# Patient Record
Sex: Female | Born: 1980 | Race: White | Hispanic: Yes | Marital: Married | Smoking: Never smoker
Health system: Southern US, Community
[De-identification: ages and names within clinical notes are randomized; demographics above are authoritative.]

## PROBLEM LIST (undated history)

## (undated) DIAGNOSIS — I471 Supraventricular tachycardia, unspecified: Secondary | ICD-10-CM

## (undated) DIAGNOSIS — R42 Dizziness and giddiness: Secondary | ICD-10-CM

## (undated) DIAGNOSIS — R002 Palpitations: Secondary | ICD-10-CM

## (undated) DIAGNOSIS — I208 Other forms of angina pectoris: Secondary | ICD-10-CM

## (undated) DIAGNOSIS — I2089 Other forms of angina pectoris: Secondary | ICD-10-CM

## (undated) HISTORY — DX: Dizziness and giddiness: R42

## (undated) HISTORY — DX: Palpitations: R00.2

## (undated) HISTORY — PX: DILATION AND CURETTAGE OF UTERUS: SHX78

## (undated) HISTORY — DX: Other forms of angina pectoris: I20.89

## (undated) HISTORY — DX: Supraventricular tachycardia: I47.1

## (undated) HISTORY — DX: Supraventricular tachycardia, unspecified: I47.10

## (undated) HISTORY — DX: Other forms of angina pectoris: I20.8

---
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
## 2000-09-19 ENCOUNTER — Other Ambulatory Visit: Admission: RE | Admit: 2000-09-19 | Discharge: 2000-09-19 | Payer: Self-pay | Admitting: Gynecology

## 2001-12-01 ENCOUNTER — Encounter: Payer: Self-pay | Admitting: Obstetrics

## 2001-12-01 ENCOUNTER — Inpatient Hospital Stay (HOSPITAL_COMMUNITY): Admission: AD | Admit: 2001-12-01 | Discharge: 2001-12-01 | Payer: Self-pay | Admitting: Family Medicine

## 2001-12-26 ENCOUNTER — Encounter (INDEPENDENT_AMBULATORY_CARE_PROVIDER_SITE_OTHER): Payer: Self-pay | Admitting: Specialist

## 2001-12-26 ENCOUNTER — Inpatient Hospital Stay (HOSPITAL_COMMUNITY): Admission: AD | Admit: 2001-12-26 | Discharge: 2001-12-26 | Payer: Self-pay | Admitting: Obstetrics

## 2001-12-31 ENCOUNTER — Inpatient Hospital Stay (HOSPITAL_COMMUNITY): Admission: AD | Admit: 2001-12-31 | Discharge: 2001-12-31 | Payer: Self-pay | Admitting: Obstetrics

## 2002-01-01 ENCOUNTER — Encounter (INDEPENDENT_AMBULATORY_CARE_PROVIDER_SITE_OTHER): Payer: Self-pay | Admitting: Specialist

## 2002-01-01 ENCOUNTER — Ambulatory Visit (HOSPITAL_COMMUNITY): Admission: RE | Admit: 2002-01-01 | Discharge: 2002-01-01 | Payer: Self-pay | Admitting: Obstetrics

## 2002-01-01 ENCOUNTER — Encounter: Payer: Self-pay | Admitting: Obstetrics

## 2002-08-09 ENCOUNTER — Inpatient Hospital Stay (HOSPITAL_COMMUNITY): Admission: AD | Admit: 2002-08-09 | Discharge: 2002-08-09 | Payer: Self-pay | Admitting: Obstetrics & Gynecology

## 2003-10-06 ENCOUNTER — Inpatient Hospital Stay (HOSPITAL_COMMUNITY): Admission: AD | Admit: 2003-10-06 | Discharge: 2003-10-06 | Payer: Self-pay | Admitting: Obstetrics

## 2003-10-31 ENCOUNTER — Ambulatory Visit (HOSPITAL_COMMUNITY): Admission: RE | Admit: 2003-10-31 | Discharge: 2003-10-31 | Payer: Self-pay | Admitting: Obstetrics & Gynecology

## 2004-01-13 ENCOUNTER — Ambulatory Visit (HOSPITAL_COMMUNITY): Admission: RE | Admit: 2004-01-13 | Discharge: 2004-01-13 | Payer: Self-pay | Admitting: Obstetrics

## 2004-02-03 ENCOUNTER — Inpatient Hospital Stay (HOSPITAL_COMMUNITY): Admission: AD | Admit: 2004-02-03 | Discharge: 2004-02-07 | Payer: Self-pay | Admitting: Obstetrics

## 2004-02-03 ENCOUNTER — Inpatient Hospital Stay (HOSPITAL_COMMUNITY): Admission: AD | Admit: 2004-02-03 | Discharge: 2004-02-03 | Payer: Self-pay | Admitting: Obstetrics

## 2004-02-03 ENCOUNTER — Ambulatory Visit: Payer: Self-pay | Admitting: Cardiology

## 2004-02-04 ENCOUNTER — Encounter: Payer: Self-pay | Admitting: Cardiology

## 2004-02-04 ENCOUNTER — Ambulatory Visit: Payer: Self-pay | Admitting: Cardiology

## 2004-02-20 ENCOUNTER — Ambulatory Visit: Payer: Self-pay | Admitting: Internal Medicine

## 2004-03-05 ENCOUNTER — Ambulatory Visit: Payer: Self-pay | Admitting: Internal Medicine

## 2004-03-05 ENCOUNTER — Ambulatory Visit (HOSPITAL_COMMUNITY): Admission: RE | Admit: 2004-03-05 | Discharge: 2004-03-05 | Payer: Self-pay | Admitting: Obstetrics & Gynecology

## 2004-03-20 ENCOUNTER — Inpatient Hospital Stay (HOSPITAL_COMMUNITY): Admission: AD | Admit: 2004-03-20 | Discharge: 2004-03-24 | Payer: Self-pay | Admitting: Obstetrics & Gynecology

## 2004-03-25 ENCOUNTER — Encounter: Admission: RE | Admit: 2004-03-25 | Discharge: 2004-04-24 | Payer: Self-pay | Admitting: Obstetrics & Gynecology

## 2004-04-02 ENCOUNTER — Ambulatory Visit: Payer: Self-pay | Admitting: Internal Medicine

## 2004-09-01 ENCOUNTER — Ambulatory Visit: Payer: Self-pay | Admitting: Internal Medicine

## 2004-09-14 ENCOUNTER — Ambulatory Visit: Payer: Self-pay | Admitting: Internal Medicine

## 2004-09-15 ENCOUNTER — Ambulatory Visit (HOSPITAL_COMMUNITY): Admission: RE | Admit: 2004-09-15 | Discharge: 2004-09-15 | Payer: Self-pay | Admitting: Obstetrics & Gynecology

## 2004-09-30 ENCOUNTER — Inpatient Hospital Stay (HOSPITAL_COMMUNITY): Admission: AD | Admit: 2004-09-30 | Discharge: 2004-09-30 | Payer: Self-pay | Admitting: Obstetrics

## 2004-12-30 ENCOUNTER — Ambulatory Visit (HOSPITAL_COMMUNITY): Admission: RE | Admit: 2004-12-30 | Discharge: 2004-12-30 | Payer: Self-pay | Admitting: Obstetrics

## 2005-01-13 ENCOUNTER — Emergency Department (HOSPITAL_COMMUNITY): Admission: EM | Admit: 2005-01-13 | Discharge: 2005-01-13 | Payer: Self-pay | Admitting: *Deleted

## 2005-01-26 ENCOUNTER — Ambulatory Visit: Payer: Self-pay | Admitting: Internal Medicine

## 2005-01-26 ENCOUNTER — Inpatient Hospital Stay (HOSPITAL_COMMUNITY): Admission: AD | Admit: 2005-01-26 | Discharge: 2005-01-26 | Payer: Self-pay | Admitting: Obstetrics & Gynecology

## 2005-01-26 ENCOUNTER — Inpatient Hospital Stay (HOSPITAL_COMMUNITY): Admission: AD | Admit: 2005-01-26 | Discharge: 2005-01-28 | Payer: Self-pay | Admitting: Obstetrics

## 2005-02-17 ENCOUNTER — Ambulatory Visit: Payer: Self-pay | Admitting: Cardiology

## 2005-02-19 ENCOUNTER — Ambulatory Visit: Payer: Self-pay

## 2005-02-19 ENCOUNTER — Encounter: Payer: Self-pay | Admitting: Cardiology

## 2005-02-22 ENCOUNTER — Ambulatory Visit: Payer: Self-pay | Admitting: Internal Medicine

## 2005-03-02 ENCOUNTER — Encounter: Admission: RE | Admit: 2005-03-02 | Discharge: 2005-03-17 | Payer: Self-pay | Admitting: Obstetrics & Gynecology

## 2005-04-13 ENCOUNTER — Inpatient Hospital Stay (HOSPITAL_COMMUNITY): Admission: AD | Admit: 2005-04-13 | Discharge: 2005-04-17 | Payer: Self-pay | Admitting: Obstetrics

## 2005-04-15 ENCOUNTER — Encounter (INDEPENDENT_AMBULATORY_CARE_PROVIDER_SITE_OTHER): Payer: Self-pay | Admitting: *Deleted

## 2005-05-06 ENCOUNTER — Ambulatory Visit: Payer: Self-pay | Admitting: Internal Medicine

## 2006-10-24 ENCOUNTER — Ambulatory Visit: Payer: Self-pay | Admitting: Internal Medicine

## 2007-09-09 IMAGING — US US OB LIMITED
1 series · 3 of 3 positions shown · non-contrast
Comparison: none

CLINICAL DATA: 38 weeks, limited study to assess presentation.

 LIMITED OBSTETRICAL ULTRASOUND:
 There is a single live intrauterine gestation in cephalic presentation.

[Series 1: ob ltd · 3 of 3 slices shown]
[im 1/3]
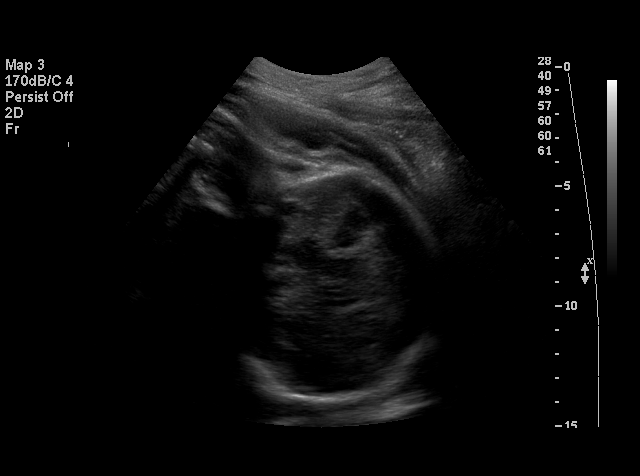
[im 2/3]
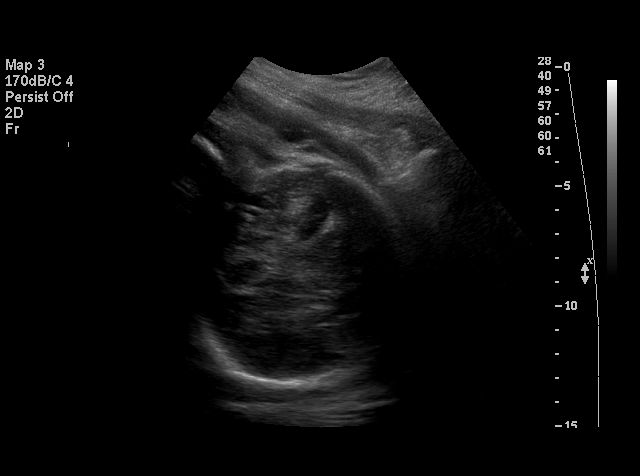
[im 3/3]
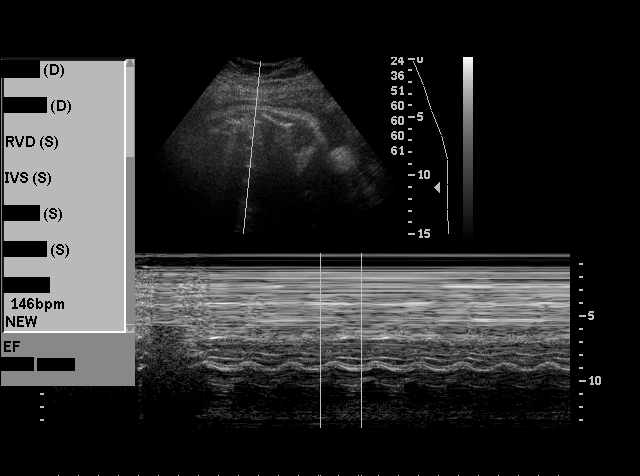

[3 of 3 positions shown; findings below may reference images not displayed]

IMPRESSION: Single live intrauterine gestation in cephalic presentation.

## 2009-05-09 ENCOUNTER — Emergency Department (HOSPITAL_COMMUNITY): Admission: EM | Admit: 2009-05-09 | Discharge: 2009-05-09 | Payer: Self-pay | Admitting: Emergency Medicine

## 2010-02-01 NOTE — L&D Delivery Note (Signed)
Delivery Note At 11:07 PM a viable female was delivered via Vaginal, Spontaneous Delivery (Presentation: Left Occiput Anterior).  APGAR: , ; weight .   Placenta status: Intact, Spontaneous.  Cord: 3 vessels with the following complications: None.    Anesthesia: Epidural  Episiotomy: None Lacerations: None Suture Repair: N/A Est. Blood Loss (mL): 200  Mom to postpartum.  Baby to nursery-stable.  JACKSON-MOORE,Sabre Leonetti A 08/29/2010, 11:19 PM

## 2010-02-25 LAB — RUBELLA ANTIBODY, IGM: Rubella: IMMUNE

## 2010-02-25 LAB — HEPATITIS B SURFACE ANTIGEN: Hepatitis B Surface Ag: NEGATIVE

## 2010-02-25 LAB — TYPE AND SCREEN: Antibody Screen: NEGATIVE

## 2010-03-29 ENCOUNTER — Encounter: Payer: Self-pay | Admitting: Internal Medicine

## 2010-03-29 ENCOUNTER — Observation Stay (HOSPITAL_COMMUNITY)
Admission: AD | Admit: 2010-03-29 | Discharge: 2010-03-30 | Disposition: A | Discharge status: Home / Self Care | Payer: Medicaid Other | Source: Ambulatory Visit | Attending: Obstetrics & Gynecology | Admitting: Obstetrics & Gynecology

## 2010-03-29 DIAGNOSIS — R002 Palpitations: Secondary | ICD-10-CM | POA: Insufficient documentation

## 2010-03-29 DIAGNOSIS — I498 Other specified cardiac arrhythmias: Secondary | ICD-10-CM | POA: Insufficient documentation

## 2010-03-29 DIAGNOSIS — O99891 Other specified diseases and conditions complicating pregnancy: Principal | ICD-10-CM | POA: Insufficient documentation

## 2010-03-29 DIAGNOSIS — I471 Supraventricular tachycardia: Secondary | ICD-10-CM

## 2010-03-29 LAB — CBC
HCT: 35.3 % — ABNORMAL LOW (ref 36.0–46.0)
WBC: 9.4 10*3/uL (ref 4.0–10.5)

## 2010-03-30 LAB — COMPREHENSIVE METABOLIC PANEL
ALT: 20 U/L (ref 0–35)
Albumin: 3 g/dL — ABNORMAL LOW (ref 3.5–5.2)
Alkaline Phosphatase: 27 U/L — ABNORMAL LOW (ref 39–117)
BUN: 6 mg/dL (ref 6–23)
CO2: 26 mEq/L (ref 19–32)
Calcium: 9.1 mg/dL (ref 8.4–10.5)
GFR calc Af Amer: >60 mL/min (ref >60)
Sodium: 136 mEq/L (ref 135–145)

## 2010-03-30 LAB — MRSA PCR SCREENING: MRSA by PCR: NEGATIVE

## 2010-03-30 LAB — TSH: TSH: 4.976 u[IU]/mL — ABNORMAL HIGH (ref 0.350–4.500)

## 2010-03-30 LAB — MAGNESIUM: Magnesium: 1.9 mg/dL (ref 1.5–2.5)

## 2010-03-31 DIAGNOSIS — R002 Palpitations: Secondary | ICD-10-CM | POA: Insufficient documentation

## 2010-03-31 DIAGNOSIS — R42 Dizziness and giddiness: Secondary | ICD-10-CM | POA: Insufficient documentation

## 2010-03-31 DIAGNOSIS — I471 Supraventricular tachycardia, unspecified: Secondary | ICD-10-CM | POA: Insufficient documentation

## 2010-04-01 ENCOUNTER — Encounter: Payer: Self-pay | Admitting: Internal Medicine

## 2010-04-01 ENCOUNTER — Ambulatory Visit (INDEPENDENT_AMBULATORY_CARE_PROVIDER_SITE_OTHER): Payer: Self-pay | Admitting: Internal Medicine

## 2010-04-01 DIAGNOSIS — I471 Supraventricular tachycardia: Secondary | ICD-10-CM

## 2010-04-09 NOTE — Assessment & Plan Note (Signed)
Summary: ec6/per pt call-mj/wpa   Visit Type:  Initial Consult   History of Present Illness: [name] (previously [place]) returns today for followup of her SVT. She has a h/o gestational SVT. When I first met her several years ago, she had almost incessant SVT which appeared to originate from the left atrium based on her P wave morphology. It was not adenosine sensitive. After delivery, her SVT always has gone away. With her second pregnancy, she had recurrent SVT and also was able to deliver without consequence. During her pregnancy the combination of a beta blocker and digoxin was fairly effective at controlling her symptoms. She is now late in the first trimester with her third pregnancy. She developed SVT several days ago and was taken to the ED at [facility] and observed and started back on her digoxin and beta blocker. She returns today for followup.  No syncope.  Current Medications (verified): 1)  Digoxin 0.25 Mg Tabs (Digoxin) .... Take One Tablet By Mouth Daily 2)  Metoprolol Tartrate 25 Mg Tabs (Metoprolol Tartrate) .... 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    Vital Signs:  Patient profile:   30 year old female Height:      68 inches Weight:      248 pounds BMI:     37.84 Pulse rate:   105 / minute BP sitting:   126 / 74  (left arm)  Vitals Entered By: Laurance Flatten CMA (April 01, 2010 10:51 AM)  Physical Exam  General:  Obese, well developed, well nourished young woman in no acute distress.  HEENT: normal Neck: supple. No JVD. Carotids 2+ bilaterally no bruits Cor: RRR no rubs, gallops or murmur Lungs: CTA Ab: soft, nontender.  nondistended. No HSM. Good bowel sounds Ext: warm. no cyanosis, clubbing or edema Neuro: alert and oriented. Grossly nonfocal. affect pleasant    EKG  Procedure date:  04/01/2010  Findings:      Sinus tachycardia with rate of: 105.   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 She will continue on metoprolol and digoxin. I have asked her to remain active and I will see her back in several months. As she has had 2 previously normal pregnancies (except for SVT), I have every reason to believe she will have a third successful pregnancy. Ideally she would not require beta blockers or digoxing until her third trimester but at this point I have recommend a period of watchful waiting. Her updated medication list for this problem includes:    Metoprolol Tartrate 25 Mg Tabs (Metoprolol tartrate) .Marland Kitchen... Take one tablet by mouth 4 times a day  Other Orders: EKG w/ Interpretation (93000)  Patient Instructions: 1)  Your physician recommends that you schedule a follow-up appointment in: 3 MONTHS WITH DR Ladona Ridgel 2)  Your physician recommends that you continue on your current medications as directed. Please refer to the Current Medication list given to you today.

## 2010-04-14 NOTE — Consult Note (Signed)
Summary: [facility] Consultation Report  [facility] Consultation Report   Imported By: Earl Many 04/10/2010 10:16:28  _____________________________________________________________________  External Attachment:    Type:   Image     Comment:   External Document

## 2010-04-20 NOTE — Consult Note (Signed)
[name], [name]              ACCOUNT NO.:  [id number]  MEDICAL RECORD NO.:  [id number]           PATIENT TYPE:  I  LOCATION:  9371                          [facility]ILITY:  [facility]  PHYSICIAN:  Georga Hacking, M.D.DATE OF BIRTH:  04-Apr-1980  DATE OF CONSULTATION:  03/29/2010                                CONSULTATION   HISTORY OF PRESENT ILLNESS:  This 30 year old Hispanic female has a history of pregnancy-induced left atrial tachycardia.  First episode occurred in January 2006 and was refractory at that time to conversion with adenosine.  She was ultimately treated with Lanoxin as well as beta- blockers.  She had recurrent SVT at a second pregnancy and was found to have incessant SVT due to left atrial tachycardia, was treated with IV Lopressor and digoxin.  She ultimately had a successful delivery and was seen in followup last by Dr. Ladona Ridgel in September 2008.  She was taken off her medications.  He recommended watchful waiting at that time and she had really not had any recurrence of SVT and she became pregnant again at this time.  She is about 17 weeks' pregnant and had the onset of palpitations and SVT this afternoon, came to the emergency room and was found to be in SVT at a rate of 140.  She spontaneously terminated and is currently in the ICU.  She is supposed to see Dr. Ladona Ridgel on Tuesday.  PAST MEDICAL HISTORY:  Remarkable for recurrent headaches as well as SVT.  PAST SURGICAL HISTORY:  D and C.  ALLERGIES:  None.  CURRENT MEDICATIONS:  Prenatal vitamins.  SOCIAL HISTORY:  She lives in [place] with her husband and has 2 children.  She is pregnant with her third.  FAMILY HISTORY:  No premature cardiac disease.  REVIEW OF SYSTEMS:  Otherwise felt well and normally works out.  Not normally any chest pain.  PHYSICAL EXAMINATION:  GENERAL:  She is a pleasant obese Hispanic female who is currently in no acute distress. VITAL SIGNS:  Blood pressure is currently  130/75.  Pulse is currently 90- 99 and regular. SKIN:  Warm and dry. ENT:  EOMI.  PERRLA.  C and S clear.  Fundi not examined negative. NECK:  Supple without masses, JVD, thyromegaly, or bruits. LUNGS:  Clear to A and P. CARDIAC:  Regular rhythm.  Normal S1 and S2.  No S3 or murmur. ABDOMEN:  Soft and gravid.  Good bowel sounds noted. EXTREMITIES:  No clubbing, cyanosis, or edema. NEUROLOGIC:  Alert and oriented x3.  Cranial nerves were normal.  Moves all extremities.  EKG shows supraventricular tachycardia at a rate of 140 initially.  She is currently in sinus rhythm.  LABORATORY DATA:  Hemoglobin of 11.8, hematocrit 35.3.  Potassium is 3.7, otherwise chemistry panel was normal.  IMPRESSION:  Pregnancy-induced left atrial tachycardia, spontaneously terminated tonight.  RECOMMENDATIONS:  At this time monitor overnight with a history of this being incessant before.  Would go ahead and restart Lanoxin as well as metoprolol.  She will be followed up in the morning by [facility].     Georga Hacking, M.D.     WST/MEDQ  D:  03/30/2010  T:  03/30/2010  Job:  [id number]  cc:   Doylene Canning. Ladona Ridgel, MD  Electronically Signed by Lacretia Nicks. Donnie Aho M.D. on 04/20/2010 05:01:27 PM

## 2010-04-22 LAB — URINALYSIS, ROUTINE W REFLEX MICROSCOPIC
Leukocytes, UA: NEGATIVE
Urobilinogen, UA: 1 mg/dL (ref 0.0–1.0)
pH: 8 (ref 5.0–8.0)

## 2010-04-22 LAB — RAPID STREP SCREEN (MED CTR MEBANE ONLY): Streptococcus, Group A Screen (Direct): NEGATIVE

## 2010-04-22 LAB — URINE MICROSCOPIC-ADD ON

## 2010-06-13 ENCOUNTER — Encounter: Payer: Self-pay | Admitting: Internal Medicine

## 2010-06-16 ENCOUNTER — Ambulatory Visit (INDEPENDENT_AMBULATORY_CARE_PROVIDER_SITE_OTHER): Payer: Medicaid Other | Admitting: Internal Medicine

## 2010-06-16 ENCOUNTER — Encounter: Payer: Self-pay | Admitting: Internal Medicine

## 2010-06-16 VITALS — BP 110/66 | Ht 68.0 in | Wt 265.0 lb

## 2010-06-16 DIAGNOSIS — I471 Supraventricular tachycardia, unspecified: Secondary | ICD-10-CM

## 2010-06-16 NOTE — Assessment & Plan Note (Signed)
[facility]                         ELECTROPHYSIOLOGY OFFICE NOTE   KAMILLA, HANDS                       MRN:          [id number]  DATE:10/24/2006                            DOB:          1994/12/25    Ms. Vanessa Barbara returns today for follow up.  She is a very pleasant, young  woman with a history of pregnancy induced SVT (left atrial tachycardia)  who returns today for followup having not been seen in over a year.  The  patient states that a couple of weeks ago she had a viral type syndrome  with a cold, and during that period developed some chest discomfort,  which would come on fleeting quick and last just a few seconds and then  go away.  It would come back-and-forth off-and-on.  It has subsequently  resolved along with her viral syndrome.  She denies any palpitations.  The patient has previously had very incessant SVT during pregnancy.  When asked today, about whether she continues to plan on becoming  pregnant again; she states that she does not plan this.  She is not work  presently.  She has been caring for her 75-and-30-year-old daughters.  She  is on no medications.   PHYSICAL EXAM:  Notable for a pleasant, well-appearing young woman in no  distress.  Blood pressure 132/75, pulse 75 and regular, respirations were 16.  The  weight was 231 pounds.  NECK:  Revealed no jugular venous distention.  LUNGS:  Clear bilaterally to auscultation.  No wheezes, rales, or  rhonchi were present.  CARDIOVASCULAR:  Exam revealed a regular rate and rhythm with a normal  S1-S2.  EXTREMITIES:  Demonstrated no cyanosis, clubbing, or edema.  The pulses  were 2+ and symmetric.   EKG demonstrates a sinus rhythm with normal axis and sinus arrhythmia.   IMPRESSION:  1. History of supraventricular tachycardia.  2. Chest pain associated with recent viral syndrome.   DISCUSSION:  Ms. Vanessa Barbara is otherwise stable.  Her chest pain has  resolved, and her palpitations,  and SVT have resolved.  I have  recommended a day period of watchful waiting.  I have asked her to call  if she has any additional problems with her SVT or with her chest  discomfort.  We will plan to see her back in the office on a p.r.n.  basis.     Doylene Canning. Ladona Ridgel, MD  Electronically Signed    GWT/MedQ  DD: 10/24/2006  DT: 10/24/2006  Job #: [id number]

## 2010-06-16 NOTE — Progress Notes (Signed)
HPI [name] returns today for followup. She is a pleasant young woman with a history of pregnancy-induced SVT. She is now in her third trimester of pregnancy. I saw her last several weeks ago when she was having tachy-palpitations and we started her back on her beta blocker and digoxin. She was initially noncompliant but now notes that she has gone back on her medicines. Since she started her medicines back she's been asymptomatic. She denies chest pain or shortness of breath. Not on File   Current Outpatient Prescriptions  Medication Sig Dispense Refill  . digoxin (LANOXIN) 0.25 MG tablet Take 250 mcg by mouth daily.        . metoprolol tartrate (LOPRESSOR) 25 MG tablet Take 25 mg by mouth 2 (two) times daily.           Past Medical History  Diagnosis Date  . Dizziness   . Palpitations   . SVT (supraventricular tachycardia)     ROS:   All systems reviewed and negative except as noted in the HPI.   Past Surgical History  Procedure Date  . Dilation and curettage of uterus      Family History  Problem Relation Age of Onset  . Diabetes       History   Social History  . Marital Status: Married    Spouse Name: N/A    Number of Children: N/A  . Years of Education: N/A   Occupational History  . Not on file.   Social History Main Topics  . Smoking status: Not on file  . Smokeless tobacco: Not on file  . Alcohol Use: Not on file  . Drug Use: Not on file  . Sexually Active: Not on file   Other Topics Concern  . Not on file   Social History Narrative    She lives in [place] with her husband and has 2  children.  She is pregnant with her third.      BP 110/66  Ht 5\' 8"  (1.727 m)  Wt 265 lb (120.203 kg)  BMI 40.29 kg/m2  Physical Exam:  Pregnant, well appearing NAD HEENT: Unremarkable Neck:  No JVD, no thyromegally Lymphatics:  No adenopathy Back:  No CVA tenderness Lungs:  Clear HEART:  Regular rate rhythm, no murmurs, no rubs, no clicks Abd:   Flat, positive bowel sounds, no organomegally, no rebound, no guarding Ext:  2 plus pulses, no edema, no cyanosis, no clubbing Skin:  No rashes no nodules Neuro:  CN II through XII intact, motor grossly intact  EKG Normal sinus rhythm   Assess/Plan:

## 2010-06-16 NOTE — Assessment & Plan Note (Signed)
Her symptoms appear to be fairly well controlled. I recommend that she continue her current medications. I do not need to see her back unless she has recurrent and symptomatic SVT despite medical therapy.

## 2010-06-16 NOTE — Patient Instructions (Signed)
Your physician recommends that you schedule a follow-up appointment as needed  

## 2010-06-19 NOTE — Discharge Summary (Signed)
[name], [name]                ACCOUNT NO.:  [id number]   MEDICAL RECORD NO.:  [id number]          PATIENT TYPE:  INP   LOCATION:  9373                          [facility]ILITY[facility]  [facility]   PHYSICIAN:  [name], M.D.DATE OF BIRTH:  28-Oct-1980   DATE OF ADMISSION:  02/03/2004  DATE OF DISCHARGE:  02/07/2004                                 DISCHARGE SUMMARY   CHIEF COMPLAINT:  The patient is a 30 year old para 0 with an estimated date  of confinement of March 30, 2004, who presents complaining of  palpitations.   HISTORY OF PRESENT ILLNESS:  The patient noted the onset of these symptoms  in the first trimester of pregnancy.  A recent EKG approximately two weeks  prior to presentation demonstrated a ventricular rate of 130 beats/minute.  No previous cardiology evaluation had been performed.  On the day of this  presentation she was found to have a ventricular rate of 180 beats/minute.  She was evaluated in [facility] Emergency Room and then transferred to the adult  ICU at the [facility].  Cardiology was consulted as well as maternal  fetal medicine.   PAST OBSTETRICAL GYNECOLOGIC HISTORY:  She is status post a spontaneous  abortion in December 2004 late first trimester with a dilatation and  curettage.   PAST MEDICAL HISTORY:  She denies.   PAST SURGICAL HISTORY:  Please see the above.   FAMILY HISTORY:  Noncontributory.   SOCIAL HISTORY:  No tobacco, ethanol, or substance abuse.   ALLERGIES:  No known drug allergies.   MEDICATIONS:  Prenatal vitamins.   PRENATAL SCREENS:  Hemoglobin 12.1, platelets 216,000.  Blood type B  positive.  RH antibody negative.  Sickle cell trait negative.  RPR  nonreactive.  Rubella immune.  Hepatitis B surface antigen negative.  HIV  nonreactive.   PHYSICAL EXAMINATION:  VITAL SIGNS:  Heart rate 150, blood pressure 160/108,  respiratory rate 16.  This was subsequent at an attempt at cardioversion  with adenosine.  The fetal heart  tracing was reassuring.  GENERAL:  Well-developed and well-nourished in no apparent distress.  HEART:  Tachycardiac.  ABDOMEN:  Nontender.  PELVIC:  Deferred.   ASSESSMENT:  Intrauterine pregnancy at 32 plus weeks with maternal  tachycardiac arrhythmia.   PLAN:  Admission and further treatment by cardiology.   HOSPITAL COURSE:  The patient was admitted.  An attempt to control her rate  with intravenous Lopressor was given.  A course of steroids was given in the  event that preterm delivery was felt to be necessary.  She was started on  digoxin with beta blocker and she was felt to have an atrial tachycardia at  this point.  An ultrasound on February 04, 2004 demonstrated an estimated  fetal weight percentiles greater than the 95th percentile symmetric with an  AFI of 10.  Thyroid function studies were normal.  She improved with the  regimen of digoxin and Lopressor.  She was felt to be in normal sinus  rhythm.  An echocardiogram was performed during this admission as well.  The  transthoracic echocardiogram performed on February 04, 2004 was essentially  normal.   DISCHARGE DIAGNOSES:  Intrauterine pregnancy at 32 plus weeks with a  maternal atrial tachycardia.   CONDITION:  Stable.   DIET:  Regular.   ACTIVITY:  No strenuous activity.   MEDICATIONS:  Included:  1.  Lopressor.  2.  Digoxin.  3.  K-Dur.   DISPOSITION:  The patient was to follow up at [facility] on  February 10, 2004 at 9 a.m.  Her followup appointment at [facility] was February 13, 2004 at 9:30 a.m.  She was also to follow up  with cardiology.      LAJ/MEDQ  D:  02/26/2004  T:  02/26/2004  Job:  [id number]

## 2010-06-19 NOTE — Consult Note (Signed)
NAMEJODI, Francis NO.:  [id number]   MEDICAL RECORD NO.:  [id number]          PATIENT TYPE:  EMS   LOCATION:  MAJO                         FACILITY:  [facility]   PHYSICIAN:  Arvilla Meres, M.D. LHCDATE OF BIRTH:  02-05-1980   DATE OF CONSULTATION:  01/26/2005  DATE OF DISCHARGE:  01/26/2005                                   CONSULTATION   OBSTETRICIAN:  Dr. Antionette Char   CARDIOLOGIST:  Dr. Lewayne Bunting   REASON FOR CONSULTATION:  Palpitations secondary to recurrent  supraventricular tachycardia.   PATIENT IDENTIFICATION:  [name] is a 30 year old woman without  significant past medical history except for paroxysmal supraventricular  tachycardia secondary to left atrial tachycardia.   In reviewing the records she was admitted to the hospital in January of 2006  during her first pregnancy with palpitations.  She was found to have an  incessant left atrial tachycardia which was not adenosine-responsive.  She  was hospitalized for about a week and treated with IV digoxin and beta  blockers and her rhythm eventually broke.  The rest of her pregnancy was  unremarkable.  She is currently 27-3/[redacted] weeks pregnant.  She was doing well  without evidence of recurrent palpitations until yesterday morning when she  once again had palpitations and felt weak.  This morning the palpitations  progressed.  She took once dose of digoxin without an effect and finally  went to [facility].  At that time she was found to have recurrent SVT  with heart rates in the 170-180 range and transferred to the [facility] ER.  In the ER here she was given Lopressor 5 mg IV and digoxin 0.1 mg.  This  dropped her blood pressure to the 70s and she was resuscitated with  intravenous fluids and now her systolic blood pressures are in the mid 90s.  I gave her an additional dose of digoxin 0.5 mg IV; however, her heart rates  remained in the 150-160 range.  She does feel weak with  this and mildly  lightheaded, but no frank syncope.  She denies any chest pain or significant  dyspnea.  She has had some mild lower extremity edema, but no overt heart  failure.  She has not had any abdominal pain or bleeding.   REVIEW OF SYSTEMS:  As per HPI and problem list, otherwise negative.   PAST MEDICAL HISTORY:  Paroxysmal SVT related to pregnancy secondary to left  atrial tachycardia.  This is non-responsive to adenosine.   CURRENT MEDICATIONS:  Prenatal vitamins, Lopressor p.r.n., digoxin p.r.n.   ALLERGIES:  He has no known drug allergies.   SOCIAL HISTORY:  She lives in [place] with her husband.  She has a 1-year-  old daughter.  She denies any tobacco or alcohol use.   FAMILY HISTORY:  There is no history of premature coronary disease.  There  is no history of long QT syndrome.  There is no history of SVT or AV nodal  re-entrant tachycardia.   PHYSICAL EXAMINATION:  GENERAL:  She is lying nearly flat in bed.  She is  comfortable.  Respirations are unlabored.  VITAL SIGNS:  Blood pressure 95/70 after 1 L fluid resuscitation.  Heart  rate is about 150-160.  This is regular.  Saturations in the high 90s on 2  L.  HEENT:  Sclerae anicteric.  EOMI.  There is no xanthelasma.  Mucous  membranes are moist.  NECK:  Supple.  There is no obvious JVD.  Carotids are 2+ bilaterally  without any bruits.  There is no lymphadenopathy or thyromegaly.  CARDIAC:  She is tachycardic and regular.  There are no murmurs, rubs, or  gallops.  LUNGS:  Clear to auscultation.  ABDOMEN:  Gravid, but soft, nontender.  She is wearing a fetal monitor.  There are good bowel sounds.  EXTREMITIES:  Warm with no clubbing, cyanosis.  Distal pulses are 1+  bilaterally.  There is trace edema.  NEUROLOGIC:  She is alert and oriented x3.  Cranial nerves II-XII are  intact.  She moves all four extremities without difficulty.  Affect is  bright.   Telemetry shows supraventricular tachycardia, rate of  150-160.  There is no  formal EKG at this point.   ASSESSMENT:  1.  Recurrent paroxysmal supraventricular tachycardia secondary to left      atrial tachycardia.  2.  27-1/[redacted] weeks gestation.  3.  Mild hypotension.   PLAN:  1.  I have discussed the situation with our electrophysiologist, Dr. Lewayne Bunting as well as Dr. Gaynell Face who is covering for her obstetrics group.      At this point we will admit her to the [facility] ICU for      observation of her heart rate and fetal monitoring.  2.  We will load her with digoxin, give her an additional 0.25 mg IV now and      repeat this in four hours.  3.  We will check a digoxin level and a TSH in the morning.  4.  We will also treat her with Lopressor 2.5 mg IV q.6h. as her blood      pressure tolerates in holding for a systolic blood pressure of less than      90.  5.  Will hydrate her aggressively to maintain a blood pressure.  6.  If there is evidence of significant fetal distress we may need to      consider a cesarean section.  We will go ahead and treat her      beclomethasone as per Dr. Elsie Stain request.   We will continue to follow with you.  Please do not hesitate to call with  questions.      Arvilla Meres, M.D. [facility]  Electronically Signed     DB/MEDQ  D:  01/26/2005  T:  01/27/2005  Job:  [id number]

## 2010-06-19 NOTE — Consult Note (Signed)
[name], BEG NO.:  [id number]   MEDICAL RECORD NO.:  [id number]          PATIENT TYPE:  EMS   LOCATION:  MAJO                         FACILITY:  [facility]   PHYSICIAN:  Vida Roller, M.D.   DATE OF BIRTH:  September 29, 1980   DATE OF CONSULTATION:  DATE OF DISCHARGE:                                   CONSULTATION   CARDIOLOGY CONSULTATION   PRIMARY CARE PHYSICIAN:  Dr. Clearance Coots.  She has not ever seen a cardiologist.   HISTORY OF PRESENT ILLNESS:  [name] is a 30 year old woman who is [redacted]  weeks pregnant with her first child.  She is G2, P0, AB1, who has had an  uncomplicated pregnancy with the exception of repeated episodes of  tachycardia.  She was seen in the emergency department at [facility]  on January 13, 2004, evaluated and found to be in sinus tachycardia, and  sent home.  She subsequently came in today for a worsening episode of  tachycardia and was found to be in supraventricular tachycardia and  transported emergently to the emergency department at [facility]  where I was asked to consult.  She was given 6 mg and then 12 mg of  adenosine IV without significant breaking of her rhythm.  I gave her an  additional 12 mg of adenosine IV.  She did break the rhythm with this but  then went right back into a narrow complex tachycardia.  She is  hemodynamically stable.  She denies syncope.  She denies chest discomfort.  She denies any shortness of breath.  She has no family history of cardiac  sudden death or cardiac problems.   PAST MEDICAL HISTORY:  Really completely uncomplicated.  She is not allergic  to any medications.  She is on no medications other than prenatal vitamins  and iron.  Her pregnancy is under the care of Dr. Clearance Coots and she has had no  problems with the exception of the tachycardia.   PAST SURGICAL HISTORY:  Noncontributory.   FAMILY HISTORY:  She has no history of long QT syndrome or cardiac sudden  death.  There is  no history of supraventricular tachycardia or AV nodal  reentry tachycardia.   REVIEW OF SYSTEMS:  Generally negative for a person who is pregnant.  She is  otherwise healthy.  She does not smoke, drink, or use illicit drugs.  She  works as a Production designer, theatre/television/film at [employer] but has recently quit her job at the late  terms of her pregnancy with plans to stay home with her child.  Her child's  perinatal history has also been normal.   PHYSICAL EXAMINATION:  GENERAL:  She is a well-developed, well-nourished,  obviously gravid Hispanic female in no apparent distress, alert and oriented  x4 and a very good historian.  Her heart rate is 180 in supraventricular  tachycardia.  Her blood pressure is 130/70, respiratory rate 14, and she is  afebrile.  HEENT:  Unremarkable.  NECK:  Supple.  There is no jugular venous distention or carotid bruits.  CHEST:  Is clear to auscultation.  CARDIAC EXAM:  Tachycardic with no obvious murmur.  ABDOMEN:  Obviously gravid but soft.  She has a field monitor in place which  appears to show a relatively reactive tracing.  EXTREMITIES:  Her lower extremities are without clubbing, cyanosis, or  edema.  Her pulses are all 2+.   STUDIES:  Her electrocardiogram shows a narrow complex tachycardia at a rate  of 176 with no PR interval.  She has a QRS duration of 83 milliseconds and a  QT corrected of 456.   LABORATORY DATA:  Her white blood cell count is 9.3, hemoglobin 10,  hematocrit 30, with a platelet count of 239,000.  Her sodium is 136,  potassium 3.4, chloride 107, bicarb 24, glucose 110, BUN 4, creatinine 0.6,  calcium 8.9.  The remainder of her liver function studies are within normal  limits.   ASSESSMENT:  This is a woman with supraventricular tachycardia likely from a  reentry nodal focus.  It is resistant to adenosine, likely due to her gravid  state and also her electrolyte disorders.  She also has an intrauterine  pregnancy which appears to be doing fine.  I  have had discussions with both  Dr. Penne Lash, Dr. Gavin Potters, and Dr. Ladona Ridgel regarding this woman's condition  and our plan is to use IV beta blockers to try to slow her heart rate down.  This appears to be a safe method in pregnancy and she should be able to  tolerate it fine.  We are going to transfer her back to [facility] to  the intensive care unit there where she will be observed in a facility that  has the ability to rapidly deliver this child if indeed she becomes  unstable.      Trey Paula   JH/MEDQ  D:  02/03/2004  T:  02/03/2004  Job:  [id number]

## 2010-06-19 NOTE — H&P (Signed)
[name], [name]                ACCOUNT NO.:  [id number]   MEDICAL RECORD NO.:  [id number]          PATIENT TYPE:  INP   LOCATION:  9374                          [facility]  [facility]   PHYSICIAN:  [name], M.D.DATE OF BIRTH:  May 04, 1980   DATE OF ADMISSION:  04/13/2005  DATE OF DISCHARGE:                                HISTORY & PHYSICAL   CHIEF COMPLAINT:  The patient is a 30 year old gravida 3, para 1 with an  estimated date of confinement of April 24, 2005 with an intrauterine  pregnancy at 38+ weeks with paroxysmal supraventricular tachyarrhythmia  secondary to left atrial tachycardia, now complaining of palpitations and  light headedness.   HISTORY OF PRESENT ILLNESS:  Please see the above. The patient has been  followed collaboratively this pregnancy with cardiology for the  tachyarrhythmia. As per the cardiologist's recommendation, her medications  to control her ventricular rate were titrated over the past couple of days  to prevent a fetal bradycardia at the time of labor and delivery.  She has previously been on Lopressor XL 100 mg once daily and digoxin 0.25  mg daily. Her symptoms have been adequately controlled on this regimen until  today after the medications had been titrated to half of the dosage.   ALLERGIES:  None.   MEDICATIONS:  Lopressor, digoxin, K-Dur.   RISK FACTORS:  Please see the above.   LABORATORY DATA:  Blood type B positive, antibody screen negative.  Hemoglobin 11.8, hematocrit 37.8, platelets 242,000. GBS negative on April 01, 2005. One hour GCT 113. Hepatitis B surface antigen negative. HIV  nonreactive. RPR nonreactive. An ultrasound on March 05, 2005 at 32 weeks,  6 days: normal amniotic fluid index, placenta posterior with an accessory  lobe located in the fundus. The estimated fetal weight percentile was 43rd  percentile.   PAST MEDICAL HISTORY:  Please see the above.   PAST SURGICAL HISTORY:  D&C.   FAMILY HISTORY:  Adult  onset diabetes.   PAST OBSTETRICAL HISTORY:  She is status post an NSVD in February of 2006.   PHYSICAL EXAMINATION:  VITAL SIGNS:  Blood pressure 140/80.  HEART:  Maternal pulse approximately 130. Fetal heart rate by Doppler 140's.  PELVIC EXAM:  Deferred.   ASSESSMENT:  Primipara at term with a history of paroxysmal supraventricular  tachyarrhythmia, now symptomatic.  Borderline elevated blood pressures.   PLAN:  Admission.  Telemetry. Monitor the fetal heart rate. The patient was  reviewed with her cardiologist and it was felt that at this time the digoxin  should be held and to continue the Lopressor XL at 50 mg daily. Also to  induce labor when she is not tachycardic.      [name], M.D.  Electronically Signed     LAJ/MEDQ  D:  04/13/2005  T:  04/13/2005  Job:  [id number]

## 2010-06-19 NOTE — Discharge Summary (Signed)
[name], [name]                ACCOUNT NO.:  [id number]   MEDICAL RECORD NO.:  [id number]          PATIENT TYPE:  INP   LOCATION:  9374                          [facility]   PHYSICIAN:  [name], M.D.DATE OF BIRTH:  1980-02-09   DATE OF ADMISSION:  01/26/2005  DATE OF DISCHARGE:  01/28/2005                                 DISCHARGE SUMMARY   CHIEF COMPLAINT:  The patient is a 30 year old who presented to the [facility] emergency department with palpitations. She has a known intrauterine  pregnancy at 27+ weeks.   HISTORY OF PRESENT ILLNESS:  The patient has a history of a paroxysmal  supraventricular tachycardia. She also reported light headedness. The  patient is currently on digoxin and she had taken her digoxin on the day of  presentation.   PAST SURGICAL HISTORY:  None.   MEDICATIONS:  Digoxin.   ALLERGIES:  No known drug allergies.   SOCIAL HISTORY:  No tobacco or ethanol use.   PHYSICAL EXAMINATION:  VITAL SIGNS:  Heart rate 170.  LUNGS:  Clear to auscultation bilaterally.  HEART:  Tachycardic, regular rhythm.  ABDOMEN:  Gravid.  PELVIC EXAM:  Deferred.   Fetal heart tracing reassuring.   ASSESSMENT:  Intrauterine pregnancy at 27+ weeks. Maternal tachyarrhythmia.   PLAN:  Admission. Cardiology consultation. Cardiology was consulted. She was  loaded with digoxin, Lopressor was started parentally and aggressive  hydration was recommended as well. She responded to this regimen. The  Lopressor was changed to Toprol orally. Her heart rate remained less than  120 and she was discharged to home.   DISCHARGE DIAGNOSES:  1.  Intrauterine pregnancy at 27+ weeks.  2.  Paroxysmal supraventricular tachyarrhythmia.   CONDITION ON DISCHARGE:  Stable.   DIET:  Regular.   ACTIVITY:  Ad lib.   MEDICATIONS:  Toprol and digoxin.   DISPOSITION:  The patient is to follow up with cardiology. She was also to  follow up in the office in 1 week.      [name], M.D.  Electronically Signed     LAJ/MEDQ  D:  02/23/2005  T:  02/23/2005  Job:  [id number]

## 2010-06-19 NOTE — Op Note (Signed)
   NAME:  [name], [name]                          ACCOUNT NO.:  [id number]   MEDICAL RECORD NO.:  [id number]                   PATIENT TYPE:  AMB   LOCATION:  SDC                                  [facility]   PHYSICIAN:  Charles A. Clearance Coots, M.D.             DATE OF BIRTH:  [birth year]   DATE OF PROCEDURE:  01/01/2002  DATE OF DISCHARGE:                                 OPERATIVE REPORT   PREOPERATIVE DIAGNOSIS:  First trimester incomplete spontaneous abortion.   POSTOPERATIVE DIAGNOSIS:  First trimester incomplete spontaneous abortion.   PROCEDURE:  Suction, evacuation of products of conception.   SURGEON:  Charles A. Clearance Coots, M.D.   ANESTHESIA:  MAC with paracervical block.   ESTIMATED BLOOD LOSS:  Approximately 50 ml.   SPECIMENS:  Products of conception.   DESCRIPTION OF PROCEDURE:  The patient was brought to the operating room and  after satisfactory IV sedation legs were brought up in stirrups and the  vagina was prepped and draped in the usual sterile fashion.  The urinary  bladder was emptied of approximately 100 ml of clear urine.  Bimanual  examination revealed the uterus to be midposition, about eight weeks size.  Sterile speculum was inserted in the vaginal vault, and the cervix was  isolated.  The anterior lip of the cervix was grasped with a double-tooth  tenaculum.  The cervical os was observed to be opened and tissue consistent  with products of conception were noted in the cervical os.  The tissue was  grasped with a polyp forceps and submitted to pathology for evaluation.  The  uterus was then sounded for the confirmation of the axis and a #8 suction  catheter was easily introduced into the uterine cavity, and all contents  were evacuated.  The uterus contracted down quite well.  At the conclusion  of the procedure, there was no active bleeding.  At the conclusion of the  procedure, all instruments were retired.  The patient tolerated the  procedure well and  was transported to the recovery room in satisfactory  condition.                                               Charles A. Clearance Coots, M.D.    CAH/MEDQ  D:  01/01/2002  T:  01/01/2002  Job:  [id number]   cc:   [facility]  Attention:  Dr. Clearance Coots

## 2010-06-19 NOTE — Consult Note (Signed)
[name], [name]                ACCOUNT NO.:  [id number]   MEDICAL RECORD NO.:  [id number]          PATIENT TYPE:  INP   LOCATION:  9373                          [facility]   PHYSICIAN:  Doylene Canning. Ladona Ridgel, M.D.  DATE OF BIRTH:  July 29, 1980   DATE OF CONSULTATION:  02/04/2004  DATE OF DISCHARGE:                                   CONSULTATION   REFERRED BY:  Charlton Haws, M.D. and Vida Roller, M.D.   HISTORY OF PRESENT ILLNESS:  The patient is a 30 year old woman who was  referred for evaluation of SVT at [redacted] weeks gestation.  The patient states  that her history dates back several months when she had brief episodes of  tachy-palpitations when she bent over and stood back up.  This occurred  again back in September lasting for several hours.  On the day of her  admission to the hospital, she complained of feeling fatigued and weak with  very mild dyspnea.  In the emergency department, she was found to be in a  narrow QRS tachycardia at a rate of 180 beats per minute. The was treated  with intravenous adenosine as well as IV beta blockers and ultimately  calcium channel blockers. This resulted in slowing of the tachycardia but  immediate reinitiation of it.  She was admitted to the hospital.  The  patient's blood pressure has been maintained in the low 100 range. Fetal  monitoring has been stable. She is referred now for additional evaluation.  The patient denies any remote history of palpitations or SVT until her  present pregnancy.   PAST MEDICAL HISTORY:  Negative.   FAMILY HISTORY:  Negative for any heart racing problems.   SOCIAL HISTORY:  The patient is married, she denies tobacco or ethanol use.   REVIEW OF SYMPTOMS:  As previously noted in the HPI otherwise negative.   PHYSICAL EXAMINATION:  GENERAL:  She is a pleasant well appearing 23-year-  old woman in no distress.  VITAL SIGNS:  Blood pressure was 110/70, pulse was 150 and regular,  respirations were 20,  temperature 98.  HEENT:  Normocephalic, atraumatic.  Pupils equal and round. Oropharynx was  moist. There was no thyromegaly. The trachea was midline, the sclera were  anicteric.  The carotids were 2+ and symmetric.  LUNGS:  Clear bilaterally to auscultation. There were wheezes, rales or  rhonchi.  CARDIOVASCULAR:  Revealed irregular tachycardia with normal S1 and S2.  ABDOMEN:  Soft, nontender, nondistended.  She was a third trimester  pregnancy.  EXTREMITIES:  Demonstrated no cyanosis or clubbing. There was very trace  bilateral peripheral edema.   EKG demonstrates a narrow QRS tachycardia.  Interestingly enough, review of  the old EKG's demonstrate a short RP tachycardia at rates of 180 beats per  minute. They also demonstrate a long RP tachy at rates of 150-60 beats per  minute.  Carotid sinus massage demonstrated transient AV block and  additional adenosine resulted in termination of the tachycardia with prompt  reinitiation of the tachycardia at rates of 150-60 beats per minute.  Evaluation of the P  wave morphology of the patient's tachycardia  demonstrated positive P waves in the inferior leads as well as negative P  waves in leads 1 and AVL all suggestive of left atrial tachycardia  originating from high in the left atrium perhaps near the left superior  pulmonary vein or over the right superior pulmonary vein.   IMPRESSION:  1.  Incessant SVT secondary to left atrial tachycardia.  2.  [redacted] week gestation.   DISCUSSION:  Will continue IV Lopressor and calcium channel blockers for  now.  Will also add intravenous digoxin.  If the patient remains in atrial  tachycardia then will plan to try a class I antiarrhythmic drug.  Unfortunately with persistent atrial tachycardia, there will ultimately be a  tachycardia __________ cardiomyopathy over the course of the following two  weeks and so this arrhythmia will have to be controlled at some point or  early delivery will be  required.   Of note, the patient's TSH was normal.       GWT/MEDQ  D:  02/04/2004  T:  02/04/2004  Job:  [id number]   cc:   Kathreen Cosier, M.D.  [address]  [place]  [place] [address]  Fax: [phone]   Vida Roller, M.D.  Fax: [phone]

## 2010-06-22 ENCOUNTER — Encounter: Payer: Self-pay | Admitting: Internal Medicine

## 2010-07-16 ENCOUNTER — Telehealth: Payer: Self-pay | Admitting: Internal Medicine

## 2010-07-16 NOTE — Telephone Encounter (Signed)
Spoke with pt and let her know that 08/20/10 was the earliest appointment and that if she went into labor prior and had problems they can call us

## 2010-07-16 NOTE — Telephone Encounter (Signed)
Called pt to schedule for 7-19 at 4p , she wants to know if she can be seen sooner said ob wanted her seen before due date, but she's not due until the end of July first of august, also wanted to know if normal to have a "prickly" feeling in chest

## 2010-07-16 NOTE — Telephone Encounter (Signed)
Scheduler to call and schedule appoinment for 08/20/10 at 4pm

## 2010-07-16 NOTE — Telephone Encounter (Signed)
Pt wants to be seen prior to next available she is due first part of aug and wants to be seen prior to that

## 2010-08-07 ENCOUNTER — Inpatient Hospital Stay (HOSPITAL_COMMUNITY)
Admission: AD | Admit: 2010-08-07 | Discharge: 2010-08-07 | Disposition: A | Discharge status: Home / Self Care | Payer: Medicaid Other | Source: Ambulatory Visit | Attending: Obstetrics & Gynecology | Admitting: Obstetrics & Gynecology

## 2010-08-07 DIAGNOSIS — O9989 Other specified diseases and conditions complicating pregnancy, childbirth and the puerperium: Secondary | ICD-10-CM

## 2010-08-07 DIAGNOSIS — I498 Other specified cardiac arrhythmias: Secondary | ICD-10-CM

## 2010-08-07 DIAGNOSIS — O99891 Other specified diseases and conditions complicating pregnancy: Secondary | ICD-10-CM | POA: Insufficient documentation

## 2010-08-07 LAB — URINALYSIS, ROUTINE W REFLEX MICROSCOPIC
Glucose, UA: NEGATIVE mg/dL
Ketones, ur: NEGATIVE mg/dL
Nitrite: NEGATIVE
Urobilinogen, UA: 0.2 mg/dL (ref 0.0–1.0)

## 2010-08-07 LAB — CBC
Hemoglobin: 11.7 g/dL — ABNORMAL LOW (ref 12.0–15.0)
RBC: 4.37 MIL/uL (ref 3.87–5.11)

## 2010-08-09 ENCOUNTER — Telehealth: Payer: Self-pay | Admitting: Adult Health

## 2010-08-09 NOTE — Telephone Encounter (Signed)
Call from patient stating that when she gets up to walk, she becomes dizzy and feels heart rate go up.  She is [redacted] weeks pregnant and is otherwise feeling well.  I have advised her to rest and stay off of her feet until in the am.Will forward this message to Dr. Ladona Ridgel so that he can advise further.  She is to call for further episodes of rapid HR if at rest.   Joni Reining NP

## 2010-08-10 ENCOUNTER — Telehealth: Payer: Self-pay | Admitting: Internal Medicine

## 2010-08-10 ENCOUNTER — Inpatient Hospital Stay (HOSPITAL_COMMUNITY)
Admission: AD | Admit: 2010-08-10 | Discharge: 2010-08-20 | DRG: 781 | Disposition: A | Discharge status: Home / Self Care | Payer: Medicaid Other | Source: Ambulatory Visit | Attending: Obstetrics & Gynecology | Admitting: Obstetrics & Gynecology

## 2010-08-10 ENCOUNTER — Ambulatory Visit: Payer: Medicaid Other

## 2010-08-10 ENCOUNTER — Ambulatory Visit (INDEPENDENT_AMBULATORY_CARE_PROVIDER_SITE_OTHER): Payer: Medicaid Other | Admitting: Internal Medicine

## 2010-08-10 ENCOUNTER — Encounter (HOSPITAL_COMMUNITY): Payer: Self-pay | Admitting: *Deleted

## 2010-08-10 VITALS — BP 102/62 | Ht 67.5 in | Wt 264.0 lb

## 2010-08-10 DIAGNOSIS — O99891 Other specified diseases and conditions complicating pregnancy: Principal | ICD-10-CM | POA: Diagnosis present

## 2010-08-10 DIAGNOSIS — O2690 Pregnancy related conditions, unspecified, unspecified trimester: Secondary | ICD-10-CM | POA: Diagnosis present

## 2010-08-10 DIAGNOSIS — I498 Other specified cardiac arrhythmias: Secondary | ICD-10-CM | POA: Diagnosis present

## 2010-08-10 DIAGNOSIS — R42 Dizziness and giddiness: Secondary | ICD-10-CM | POA: Diagnosis present

## 2010-08-10 DIAGNOSIS — I471 Supraventricular tachycardia: Secondary | ICD-10-CM

## 2010-08-10 DIAGNOSIS — K5900 Constipation, unspecified: Secondary | ICD-10-CM | POA: Diagnosis present

## 2010-08-10 DIAGNOSIS — R002 Palpitations: Secondary | ICD-10-CM | POA: Diagnosis present

## 2010-08-10 LAB — URINALYSIS, ROUTINE W REFLEX MICROSCOPIC
Bilirubin Urine: NEGATIVE
Nitrite: NEGATIVE
Specific Gravity, Urine: 1.01 (ref 1.005–1.030)
Urobilinogen, UA: 0.2 mg/dL (ref 0.0–1.0)

## 2010-08-10 LAB — CBC
MCH: 27.1 pg (ref 26.0–34.0)
MCHC: 32.8 g/dL (ref 30.0–36.0)
MCV: 82.7 fL (ref 78.0–100.0)
Platelets: 187 10*3/uL (ref 150–400)
RBC: 4.39 MIL/uL (ref 3.87–5.11)
RDW: 14.7 % (ref 11.5–15.5)
WBC: 10.5 10*3/uL (ref 4.0–10.5)

## 2010-08-10 LAB — BASIC METABOLIC PANEL
BUN: 9 mg/dL (ref 6–23)
Calcium: 9 mg/dL (ref 8.4–10.5)
Chloride: 105 mEq/L (ref 96–112)
Creatinine, Ser: <0.47 mg/dL — ABNORMAL LOW (ref 0.50–1.10)
Potassium: 3.9 mEq/L (ref 3.5–5.1)

## 2010-08-10 LAB — RPR: RPR Ser Ql: NONREACTIVE

## 2010-08-10 LAB — URINE MICROSCOPIC-ADD ON

## 2010-08-10 MED ORDER — SODIUM CHLORIDE 0.9 % IV BOLUS (SEPSIS)
250.0000 mL | Freq: Once | INTRAVENOUS | Status: AC
  Administered 2010-08-10: 250 mL via INTRAVENOUS

## 2010-08-10 MED ORDER — SODIUM CHLORIDE 0.45 % IV SOLN: INTRAVENOUS | Status: DC

## 2010-08-10 MED ORDER — METOPROLOL TARTRATE 12.5 MG HALF TABLET
12.5000 mg | ORAL_TABLET | Freq: Two times a day (BID) | ORAL | Status: DC
  Administered 2010-08-10 – 2010-08-11 (×2): 12.5 mg via ORAL
  Filled 2010-08-10 (×4): qty 1

## 2010-08-10 MED ORDER — SODIUM CHLORIDE 0.9 % IV SOLN
INTRAVENOUS | Status: AC
  Administered 2010-08-10 (×2): via INTRAVENOUS

## 2010-08-10 MED ORDER — DIGOXIN 0.25 MG/ML IJ SOLN
0.2500 mg | INTRAMUSCULAR | Status: DC
  Administered 2010-08-10: 0.25 mg via INTRAVENOUS
  Filled 2010-08-10 (×3): qty 1

## 2010-08-10 MED ORDER — DIGOXIN 250 MCG PO TABS
0.2500 mg | ORAL_TABLET | Freq: Every day | ORAL | Status: DC
  Filled 2010-08-10 (×2): qty 1

## 2010-08-10 MED ORDER — PRENATAL PLUS 27-1 MG PO TABS
1.0000 | ORAL_TABLET | Freq: Every day | ORAL | Status: DC
  Administered 2010-08-11 – 2010-08-20 (×10): 1 via ORAL
  Filled 2010-08-10 (×10): qty 1

## 2010-08-10 MED ORDER — METOPROLOL TARTRATE 25 MG PO TABS
25.0000 mg | ORAL_TABLET | Freq: Four times a day (QID) | ORAL | Status: DC
  Filled 2010-08-10 (×3): qty 1

## 2010-08-10 MED ORDER — DOCUSATE SODIUM 100 MG PO CAPS
100.0000 mg | ORAL_CAPSULE | Freq: Every day | ORAL | Status: DC
  Administered 2010-08-11 – 2010-08-20 (×10): 100 mg via ORAL
  Filled 2010-08-10 (×10): qty 1

## 2010-08-10 MED ORDER — DIGOXIN 250 MCG PO TABS
0.2500 mg | ORAL_TABLET | Freq: Every day | ORAL | Status: DC
  Administered 2010-08-11 – 2010-08-12 (×2): 0.25 mg via ORAL
  Filled 2010-08-10: qty 1

## 2010-08-10 MED ORDER — METOPROLOL TARTRATE 25 MG PO TABS
25.0000 mg | ORAL_TABLET | Freq: Four times a day (QID) | ORAL | Status: DC
  Filled 2010-08-10: qty 1

## 2010-08-10 MED ORDER — ZOLPIDEM TARTRATE 10 MG PO TABS: 10.0000 mg | ORAL_TABLET | Freq: Every evening | ORAL | Status: DC | PRN

## 2010-08-10 MED ORDER — ACETAMINOPHEN 325 MG PO TABS
650.0000 mg | ORAL_TABLET | ORAL | Status: DC | PRN
  Administered 2010-08-13 – 2010-08-19 (×4): 650 mg via ORAL
  Filled 2010-08-10 (×4): qty 2

## 2010-08-10 MED ORDER — SODIUM CHLORIDE 0.9 % IV SOLN
250.0000 mL | Freq: Once | INTRAVENOUS | Status: AC
  Administered 2010-08-10: 250 mL via INTRAVENOUS

## 2010-08-10 MED ORDER — CALCIUM CARBONATE ANTACID 500 MG PO CHEW
2.0000 | CHEWABLE_TABLET | ORAL | Status: DC | PRN
  Administered 2010-08-11: 400 mg via ORAL
  Filled 2010-08-10: qty 2

## 2010-08-10 MED ORDER — METOPROLOL TARTRATE 25 MG PO TABS
25.0000 mg | ORAL_TABLET | Freq: Four times a day (QID) | ORAL | Status: DC
  Filled 2010-08-10 (×5): qty 1

## 2010-08-10 NOTE — Telephone Encounter (Signed)
Pt has been in SVT since Friday when she went to [facility] ER because she is [redacted]wks pregnant and she has been light headed and numbness in her hands and she wants to be seen

## 2010-08-10 NOTE — Telephone Encounter (Signed)
Patient will come in for an EKG today

## 2010-08-10 NOTE — H&P (Signed)
  Chief complaint:  The patient is a 30 year old gravida 4 para 2 with estimated date of confinement by an 11 week ultrasound of 09/09/2010. The patient has a history of episodic supraventricular tachycardia. She presented to a cardiology visit on the day of presentation complaining of dizziness and palpitations.   History of present illness: The patient has a history of episodic supraventricular tachycardia that has complicated her previous pregnancies. The tachycardia is felt to be an atrial tachycardia. The issue resolves in between pregnancies. The patient has been managed on digoxin and metoprolol. There have been issues with compliance with this regimen. The patient has reported several days of episodic palpitations and dizziness. She presented to the maternity admissions unit several days ago with similar complaints and was found to be in normal sinus rhythm. She's also been hospitalized earlier in the pregnancy with a with similar episodes.  Allergies: No known drug allergies new  Medications: Please see the medication reconciliation are documented.   OB risk factors: please see the above  Past OB history: In October 2004 there was a spontaneous abortion. In February of 2006 she was delivered of a 10 lbs. 1 oz. infant vaginal delivery no complications. In March of 2007 she was delivered at full term 6 lbs. 14 oz. female vaginal delivery no complications.   Prenatal labs:  2 hour glucose tolerance test normal, hepatitis B surface antigen, negative hematocrit 37, hemoglobin 12.9, HIV nonreactive, platelets  51,000, blood type is B+, antibody screen negative, RPR nonreactive, rubella immune, RPR nonreactive, serial ultrasounds on March 27 and June 13.  Past GYN history: D&C  Past medical history: Gestational diabetes  Past surgical history: Please see above.   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 She is married living with her spouse. Not now using alcohol  but formally a minimal user. Has no significant smoking history.   Family history: Adult onset diabetes mellitus, hypertension, hyperthyroidism    Review of systems:   Cardiovascular: please see the above    Physical exam:  Vital signs: her vital signs were reviewed. The fetal heart tracing was reviewed. and at the   General: NAD Cardiac exam per the recent cardiology visit  Pelvic exam deferred   Assessment:  Intrauterine pregnancy at 35+ weeks complicated by a maternal supraventricular tachyarrhythmia. The maternal heart rate is not well controlled at present however she remains stable. The fetal heart tracing is consistent with fetal well-being.   Plan:  Admission; telemetry. Appreciate consultation with cardiology, maternal fetal medicine plan.  Optimize control of the patient's heart rate with medications. The patient may be a candidate for an amniocentesis for fetal lung maturity and subsequent delivery.

## 2010-08-10 NOTE — Progress Notes (Signed)
HPI Patient is a 30 year old with a history of SVT (atrial tachycardia) that is related to pregnancy.  Since Thursday she has been in and out of SVT.  Dizzy, weak.  Seen in ER in [place] at time.  Today  Feeling poorly.  Comes to clinic.  Dizzy.  Almost passed out. Episode of SVT has not stopped.   No Known Allergies  Current Outpatient Prescriptions  Medication Sig Dispense Refill  . digoxin (LANOXIN) 0.25 MG tablet Take 250 mcg by mouth daily.        . metoprolol tartrate (LOPRESSOR) 25 MG tablet Take 25 mg by mouth 4 (four) times daily.         Past Medical History  Diagnosis Date  . Dizziness   . Palpitations   . SVT (supraventricular tachycardia)     Past Surgical History  Procedure Date  . Dilation and curettage of uterus     Family History  Problem Relation Age of Onset  . Diabetes      History   Social History  . Marital Status: Married    Spouse Name: N/A    Number of Children: N/A  . Years of Education: N/A   Occupational History  . Not on file.   Social History Main Topics  . Smoking status: Never Smoker   . Smokeless tobacco: Not on file  . Alcohol Use: Not on file  . Drug Use: Not on file  . Sexually Active: Not on file   Other Topics Concern  . Not on file   Social History Narrative    She lives in [place] with her husband and has 2  children.  She is pregnant with her third.     Review of Systems:  All systems reviewed.  They are negative to the above problem except as previously stated.  Vital Signs: There were no vitals taken for this visit.  Physical Exam Patient in mild distress due to fast heart rate, dizziness.  HEENT:  Normocephalic, atraumatic. EOMI, PERRLA.  Neck: JVP is normal. No thyromegaly. No bruits.  Lungs: clear to auscultation. No rales no wheezes.  Heart: Regular rate and rhythm. Normal S1, S2. No S3.   No significant murmurs. PMI not displaced.  Abdomen:  Distent  Extremities:   Good distal pulses throughout. No  lower extremity edema.  Musculoskeletal :moving all extremities.  Neuro:   alert and oriented x3.  CN II-XII grossly intact.  EKG:   SVT.  159 bpm. Assessment and Plan:

## 2010-08-10 NOTE — Telephone Encounter (Signed)
Patient states that her heart has been racing since Friday. No SOB or CP but she is dizzy and lightheaded. She states she went to [facility] ER on Friday and they did an EKG with labs. EKG shows sinus tachycardia at a rate of 107. They did a urinalysis and a CBC as well. Will discuss with DOD.

## 2010-08-10 NOTE — Telephone Encounter (Signed)
Spoke with Dr. Tenny Craw, she would like for patient to come into the office for an EKG and to check orthostatic VS. LMTCB

## 2010-08-10 NOTE — Assessment & Plan Note (Signed)
Patient is a 30 year old with a history of SVT--atrial tachycardia, probably from L atrium ( Not responsive to adenosine as atrial origin).  Pregnancy related.  Has had with other 3 pregnancies.  Tolerated in past.   Now for the past 4 days has been in and out of SVT.  Presyncopal today.   I have discussed this with her EP (G.Taylor).  He recomm that she be admitted to [facility] hospital with plans for urgent delivery.  Patient has not had arrhythmias when not pregnant.   I would recomm continuing meds.  IV NS.  May be able to use additional low dose b blocker.   We will follow during this admit.

## 2010-08-10 NOTE — Progress Notes (Signed)
MEDICATION RELATED CONSULT NOTE - Initial  Need to pull in the pharmacy consult order. Indication: Maternal SVT  Patient Measurements: Height: 5\' 8"  (172.7 cm) Weight: 262 lb 9.6 oz (119.115 kg) IBW/kg (Calculated) : 63.9  Adjusted Body Weight: 119kg  Vital Signs: Temp: 98.1 F (36.7 C) (07/09 2000) Temp src: Oral (07/09 2000) BP: 101/46 mmHg (07/09 2000) Pulse Rate: 120  (07/09 2000) Intake/Output from previous day:   Intake/Output from this shift: I/O this shift: In: 120 [P.O.:120] Out: -   Labs: Digoxin level at 1410= 0.6ng/ml  Microbiology: Recent Results (from the past 720 hour(s))  MRSA PCR SCREENING     Status: Normal   Collection Time   08/10/10  3:00 PM      Component Value Range Status Comment   MRSA by PCR NEGATIVE  NEGATIVE  Final     Medical History: Past Medical History  Diagnosis Date  . Dizziness   . Palpitations   . SVT (supraventricular tachycardia)     Medications:  Pt currently on Digoxin 0.25mg  po daily PTA. Pt also received Digoxin 0.25mg  IV at 1730.   Assessment: Pharmacy asked to assess pt's Digoxin dose and schedule for subsequent Digoxin levels by Dietrich Pates, MD. Pt is [redacted] weeks pregnant with increased volume of distribution which may require increased maintenance Digoxin level. Goal of Therapy:  Dr. Tenny Craw requested Digoxin target level for SVT= 1-1.5ng/ml  Plan:  1. Recommend increasing Digoxin maintenance dose to 0.375mg  po daily. 2. Draw level in 5-7 days based on pt's clinical presentation to target 1-1.5ng/ml. 3. During 3rd trimester pregnancy, digoxin-like immunoassay substance (DLIS) may cross react with digoxin immunoassay.  Claybon Jabs 08/10/2010,9:19 PM

## 2010-08-10 NOTE — Telephone Encounter (Signed)
Per pt returning call back

## 2010-08-11 ENCOUNTER — Other Ambulatory Visit: Payer: Self-pay

## 2010-08-11 ENCOUNTER — Inpatient Hospital Stay (HOSPITAL_COMMUNITY): Payer: Medicaid Other

## 2010-08-11 DIAGNOSIS — I471 Supraventricular tachycardia: Secondary | ICD-10-CM

## 2010-08-11 LAB — BASIC METABOLIC PANEL
Calcium: 9 mg/dL (ref 8.4–10.5)
Potassium: 3.8 mEq/L (ref 3.5–5.1)
Sodium: 134 mEq/L — ABNORMAL LOW (ref 135–145)

## 2010-08-11 MED ORDER — SODIUM CHLORIDE 0.9 % IJ SOLN: 3.0000 mL | Freq: Two times a day (BID) | INTRAMUSCULAR | Status: DC

## 2010-08-11 NOTE — Progress Notes (Signed)
Co some chest discomfort states, "It feels like pricking in my chest."  Denies shortness of breath states, " I just take some deep breaths though."  Pt has been having some missed beats on the monitor.  Will do ekg and notify cardilogist

## 2010-08-11 NOTE — Progress Notes (Signed)
Dr Tona Sensing in to assess pt for MFM Consult.  Plan of care discussed with pt at this time.  Pt's questions answered in full and to her verbalized understanding.  Pt verbalizes understanding of and agreement with her care plan.

## 2010-08-11 NOTE — Progress Notes (Signed)
Spoke with Marylene Land at [facility].  MFM consult for today.  Pt resting comfortably in room.  EFM/toco adjusted.

## 2010-08-11 NOTE — Progress Notes (Signed)
EFM adjusted 

## 2010-08-11 NOTE — Progress Notes (Signed)
Patient saw physician in office. Nurse Visit cancelled. Please see OV note.

## 2010-08-11 NOTE — Progress Notes (Signed)
EFM and toco readjusted.  Dr Clearance Coots to come assess pt shortly.

## 2010-08-11 NOTE — Progress Notes (Signed)
Patient is doing well.  Mild dyspnea and palpitations no SSCP 100/60 SVT 158 Afebrile  S1/S2 Lungs clear BS + Trace edema Plus 3 PT  Tele:  SVT 130-160  Impression:  Discussed with Dr Ladona Ridgel and Tona Sensing.  Continue current Rx while stable.  If signs of fetal distress adenosine and  Semi-emergent DCC.  Patient has a LA tachycardia that does not respond long term to adenosine and would likely be incessant  Even with [facility].

## 2010-08-11 NOTE — Progress Notes (Signed)
EFM/toco readjusted 

## 2010-08-11 NOTE — Progress Notes (Signed)
UR chart review completed.  

## 2010-08-11 NOTE — Progress Notes (Signed)
Tracing of baby on monitor with baseline of 120 this is a change from baseline.  Notified ob rapid response nurse of change and Dr. Clearance Coots. See orders.  Maternal fetal medicine will be evaluating pt.  Dr. Clearance Coots will be up to evaluate pt. Pt with no complaints of contractions, Shortness of breath.  Remains with hr 150-160.

## 2010-08-11 NOTE — Progress Notes (Signed)
EFM and toco readjusted.  Pt continues to rest comfortably.

## 2010-08-11 NOTE — Progress Notes (Signed)
EFM/toco readjusted

## 2010-08-11 NOTE — Progress Notes (Signed)
EFM and toco readjusted.

## 2010-08-11 NOTE — Consult Note (Signed)
30 y. o. G 4 P 2012, EDD  09/13/2010, by first trimester U/S study.  Admitted 08/10/2010 at 35.2 weeks with recurrent SVT, prior history of same in other pregnancies, and  medical therapy over several weeks during this pregnancy, including digoxin and metoprolol. She failed to convert with adenosine and increased digoxin yesterday, and has persistent tachycardia in 140-170/min range, even after added metoprolol.  The addition of the metoprolol has resulted in mild maternal hypotension at rest, without measurable effect on maternal heart rate.  Vital signs are otherwise normal and fetal heart rate and uterine responses are normal so far.  Findings and condition discussed with both Mrs [name] and her husband, as well as with Dr. Clayborn Heron, the cardiology consultant.  The use of direct cardioversion as a temporizing process appears to be not optimal, based on patient's history of not having a nodal re-entrant process but more of a left atrial distension/pressure process.  We agreed that Mrs [name] and her fetus are presently stable from cardiovascular and fetal exchange standpoints.  Because the fetus is at 35 weeks, almost two weeks from early term, so long as both patients remain stable, it is reasonable to observe maternal and fetal responses in hospital daily.  Evaluation would include maternal digoxin level assessment, repeat EKG frequency by the cardiology team, daily fetal NST's with persistent bedside fetal heart rate assessment, a fetal growth [place], and daily fetal movement counts.   Indicators for delivery would be more symptomatic maternal responses, abnormalities in vital signs or pulse oximetry, cardiac decompensation,  abnormal fetal NST responses or evidence of poor fetal growth.   The MFM team will reassess the patient daily with the promary care team and is available for further communication.  Tona Sensing [phone])

## 2010-08-12 ENCOUNTER — Inpatient Hospital Stay (HOSPITAL_COMMUNITY): Payer: Medicaid Other

## 2010-08-12 DIAGNOSIS — O2690 Pregnancy related conditions, unspecified, unspecified trimester: Secondary | ICD-10-CM | POA: Diagnosis present

## 2010-08-12 MED ORDER — LACTATED RINGERS IV SOLN
INTRAVENOUS | Status: DC
  Administered 2010-08-12 – 2010-08-17 (×15): via INTRAVENOUS

## 2010-08-12 MED ORDER — METOPROLOL TARTRATE 1 MG/ML IV SOLN
5.0000 mg | Freq: Four times a day (QID) | INTRAVENOUS | Status: DC
  Administered 2010-08-12 – 2010-08-14 (×7): 5 mg via INTRAVENOUS
  Filled 2010-08-12 (×9): qty 5

## 2010-08-12 MED ORDER — DIGOXIN 0.25 MG/ML IJ SOLN
0.2500 mg | Freq: Every day | INTRAMUSCULAR | Status: DC
  Administered 2010-08-12: 0.5 mg via INTRAVENOUS
  Administered 2010-08-13 – 2010-08-14 (×2): 0.25 mg via INTRAVENOUS
  Filled 2010-08-12 (×4): qty 1

## 2010-08-12 NOTE — Progress Notes (Signed)
Pt. given 1 cotton ball with 1 drop of lavender scent oil on it by Burman Nieves, RN

## 2010-08-12 NOTE — Procedures (Signed)
EFM and toco adjusted.

## 2010-08-12 NOTE — Plan of Care (Signed)
Problem: Phase II Progression Outcomes Goal: Other Phase II Outcomes/Goals Outcome: Not Progressing Pt. Still in SVT with intermitten S&S. Dr. Clearance Coots has consulted with MFM and Cardiology. POC pending results of bedside ultasound ordered for today

## 2010-08-12 NOTE — Consult Note (Addendum)
HPI [name] is referred by Dr. Eden Emms for evaluation of incessant SVT. She is well known to me from 2 prior pregnancies where she developed incessant SVT. Previously she has responded to a combination of IV digoxin and  Beta  Blockers. After each of her last two pregnancies I asked that she undergo tubal ligation but she refused. She presented several days ago with recurrent SVT at 160/min. She has been hypotensive with blood pressures in the 80-90 range. The patient has not had syncope but does feel dizzy. Apparently fetal monitoring reveals a [redacted] week gestation, doing well. Previously she has had documented left atrial tachycardia which was not reentrant and did not terminate with Adenosine. After her initial delivery I offered her catheter ablation but she declined. No Known Allergies   Current Facility-Administered Medications  Medication Dose Route Frequency Provider Last Rate Last Dose  . acetaminophen (TYLENOL) tablet 650 mg  650 mg Oral Q4H PRN [name], MD      . calcium carbonate (TUMS - dosed in mg elemental calcium) chewable tablet 400 mg of elemental calcium  2 tablet Oral Q4H PRN [name], MD   400 mg of elemental calcium at 08/11/10 2027  . digoxin (LANOXIN) injection 0.25 mg  0.25 mg Intravenous Daily Lewayne Bunting, MD      . docusate sodium (COLACE) capsule 100 mg  100 mg Oral Daily [name], MD   100 mg at 08/12/10 1041  . lactated ringers infusion   Intravenous Continuous Lewayne Bunting, MD      . metoprolol (LOPRESSOR) injection 5 mg  5 mg Intravenous Q6H Lewayne Bunting, MD      . prenatal vitamin w/FE, FA (PRENATAL 1 + 1) 27-1 MG tablet 1 tablet  1 tablet Oral Daily [name], MD   1 tablet at 08/12/10 1041  . zolpidem (AMBIEN) tablet 10 mg  10 mg Oral QHS PRN [name], MD      . DISCONTD: digoxin (LANOXIN) injection 0.25 mg  0.25 mg Intravenous 1 day or 1 dose Pricilla Riffle, MD   0.25 mg at 08/10/10 1835  . DISCONTD:  digoxin (LANOXIN) tablet 0.25 mg  0.25 mg Oral Daily Pricilla Riffle, MD   0.25 mg at 08/12/10 1040  . DISCONTD: metoprolol tartrate (LOPRESSOR) tablet 12.5 mg  12.5 mg Oral BID Pricilla Riffle, MD   12.5 mg at 08/11/10 1037     Past Medical History  Diagnosis Date  . Dizziness   . Palpitations   . SVT (supraventricular tachycardia)     ROS:   All systems reviewed and negative except as noted in the HPI.   Past Surgical History  Procedure Date  . Dilation and curettage of uterus      Family History  Problem Relation Age of Onset  . Diabetes    . Hyperlipidemia Mother   . Diabetes Father   . Hypertension Father   . Diabetes Paternal Grandmother      History   Social History  . Marital Status: Married    Spouse Name: N/A    Number of Children: N/A  . Years of Education: N/A   Occupational History  . Not on file.   Social History Main Topics  . Smoking status: Never Smoker   . Smokeless tobacco: Not on file  . Alcohol Use: Not on file  . Drug Use: Not on file  . Sexually Active: Yes   Other Topics Concern  . Not  on file   Social History Narrative    She lives in [place] with her husband and has 2  children.  She is pregnant with her third.      BP 85/55  Pulse 175  Temp(Src) 98.3 F (36.8 C) (Oral)  Resp 23  Ht 5\' 8"  (1.727 m)  Wt 263 lb 12.8 oz (119.659 kg)  BMI 40.11 kg/m2  SpO2 97%  LMP 10/23/2009  Physical Exam:  Well appearing pregnant woman, NAD HEENT: Unremarkable Neck:  No JVD, no thyromegally Lymphatics:  No adenopathy Back:  No CVA tenderness Lungs:  Clear with no wheezes, rales, or rhonchi HEART:  Regular tachycardia, no murmurs, no rubs, no clicks Abd:  soft, positive bowel sounds, no organomegally, no rebound, no guarding, 3rd trimester gestation Ext:  2 plus pulses, no edema, no cyanosis, no clubbing Skin:  No rashes no nodules Neuro:  CN II through XII intact, motor grossly intact  EKG SVT at 155/min.   Assess/Plan:    Patient Active Problem List  Diagnoses  . Paroxysmal supraventricular tachycardia  . DIZZINESS  . Palpitations  . Pregnancy complication  1. SVT -   Her SVT is incessant at this point. Previously, she has had SVT during pregnancy and responded to digoxin and  beta blockers. We have been limited by her hypotension. Will plan a fluid bolus and start IV digoxin and    lopressor. She is [redacted] weeks gestation and hopefully her symptoms will resolve and she can get out to 38-39   weeks before delivery. I will follow her with you. If she became unstable, then urgent C-section would be   indicated.   2. Pregnancy -   After delivery, I would recommend sterilization as she only has SVT when she is pregnant.   3. Dizziness -  Her symptoms are due to relative hypotension from being pregnant and in SVT at 160/min. Will attempt to   increase her iv fluids. Previously this increased her pressure and allowed for medical therapy to be advanced.

## 2010-08-12 NOTE — Progress Notes (Signed)
[name] was reevaluated with Dr. Ladona Ridgel, Cardiology.  From a MFM standpoint, fetal condition remains stable, with normal NST responses on constant EFM.  Ultrasound today shows a normally grown fetus, approximately 2.9 Kg, at [redacted] weeks gestation, consistent with early pregnancy information (correction from my consult note on 7/10), in cephalic presentation, with normal amniotic fluid.  [name]'s condition is unchanged with both tachypnea and tachycardia, but with sub therapeutic digoxin levels.  Dr. Lubertha Basque plan for IV fluid augmentation of plasma volume and increasing digoxin dosing to reach therapeutic range should not impair fetal condition. It will be important to assess fetal baseline heart rate after achievement of therapeutic digoxin levels to maintain a fetal heart rate range of 110-160 per min.  If maternal condition improves over several days, she may be managed in outpatient setting, with periodic reassessment of fetal responses through NST or BPP.

## 2010-08-12 NOTE — Progress Notes (Signed)
Pt removed from EFM and toco for BS MFM [place]

## 2010-08-12 NOTE — Procedures (Signed)
EFM/toco readjusted 

## 2010-08-12 NOTE — Progress Notes (Signed)
Dr. Tona Sensing and Dr. Ladona Ridgel in to see pt.and orders received

## 2010-08-12 NOTE — Progress Notes (Signed)
Pt. Admits to symptoms when HR >160. States she feels "dizzy"  "palpitations" and occ. Nausea. Symptoms improve when HR < 160. Pt. Mobilizes well to bedside commode and denied dizziness when she ambulated to bathroom for BM. Dr. Clearance Coots in to see pt and he spoke with MFM and Dr. Ladona Ridgel (cardiology) while he was here. Pt. Awaiting decide ultrasound. S.O. Remains at bedside 1115 - [place] arrived to do bedside fetal ultrasound

## 2010-08-12 NOTE — Progress Notes (Signed)
BS [place] completed and pt placed back on EFM toco and awaiting results

## 2010-08-12 NOTE — Progress Notes (Signed)
Spoke with Dr Clearance Coots r/t pts continuous monitoring which has been in place x 72 hours. FHR remains reactive when in place, discussing pts comfort and reasonable expectation of rest while in hospital. Dr Clearance Coots agrees that [facility] has continued to be reactive and reassuring. He will discuss with MFM option of QS NST. Per Dr Clearance Coots will continue with continuous monitoring overnight while considering the pts comfort level throught the night.

## 2010-08-12 NOTE — Progress Notes (Signed)
Pt resting comfortably in room.  EFM and toco adjusted at this time.  Pt denies any  UCs.  Plan of care discussed with pt at this time.  Pt's questions answered in full and to her verbalized understanding.  Pt verbalizes understanding of and agreement with her care plan.

## 2010-08-13 ENCOUNTER — Other Ambulatory Visit: Payer: Self-pay | Admitting: Internal Medicine

## 2010-08-13 DIAGNOSIS — I471 Supraventricular tachycardia: Secondary | ICD-10-CM

## 2010-08-13 LAB — BASIC METABOLIC PANEL
BUN: 6 mg/dL (ref 6–23)
Calcium: 8.1 mg/dL — ABNORMAL LOW (ref 8.4–10.5)
Potassium: 3.4 mEq/L — ABNORMAL LOW (ref 3.5–5.1)
Sodium: 137 mEq/L (ref 135–145)

## 2010-08-13 MED ORDER — ONDANSETRON HCL 4 MG/2ML IJ SOLN
4.0000 mg | Freq: Once | INTRAMUSCULAR | Status: AC
  Administered 2010-08-13: 4 mg via INTRAVENOUS
  Filled 2010-08-13: qty 2

## 2010-08-13 MED ORDER — POTASSIUM CHLORIDE CRYS ER 20 MEQ PO TBCR
40.0000 meq | EXTENDED_RELEASE_TABLET | Freq: Once | ORAL | Status: AC
  Administered 2010-08-13: 40 meq via ORAL
  Filled 2010-08-13: qty 2

## 2010-08-13 NOTE — Progress Notes (Signed)
Dr. Ladona Ridgel present  And in to talk with pt.  VS/meds reviewed.  Will proceed with 0600 Lopressor dose.

## 2010-08-13 NOTE — Progress Notes (Signed)
Report to Dr. Teena Dunk (Cardiology) re BP 74/56, repeat 113/57 and orders for 5mg  Lopressor at [place].   Pulse 140-150's at present.   Will administer per discussion.

## 2010-08-13 NOTE — Progress Notes (Signed)
Mrs. Abid remains in SVT. C/O nausea. No c/p or sob.  BP 110/50 P =110-160 R = 20\ Gen - well appearing NAD CV irreg tachy  Neuro - normal Lungs - clear ABD - soft Ext - no edema Tele - SVT with variable av conduction  Labs - reviewed A/P  1. SVT - continue digoxin IV, metoprolol IV. HR's improved some. Would tolerate SBP's in 90's.  2.Hypotension - continue fluid and encourage po intake  3. [redacted] weeks gestation - as per ob.  4. Hypokalemia - replete

## 2010-08-13 NOTE — Progress Notes (Signed)
Called [facility] to let them know FHT appears to be tracing maternal.  Pt is up to BS commode and EFM will be readjusted after she has finished using the RR.

## 2010-08-14 ENCOUNTER — Other Ambulatory Visit (HOSPITAL_COMMUNITY): Payer: Self-pay | Admitting: *Deleted

## 2010-08-14 LAB — BASIC METABOLIC PANEL
Chloride: 104 mEq/L (ref 96–112)
Creatinine, Ser: 0.49 mg/dL — ABNORMAL LOW (ref 0.50–1.10)
GFR calc Af Amer: >60 mL/min (ref >60)
GFR calc non Af Amer: >60 mL/min (ref >60)
Potassium: 3.7 mEq/L (ref 3.5–5.1)

## 2010-08-14 MED ORDER — MIDAZOLAM HCL 2 MG/2ML IJ SOLN
INTRAMUSCULAR | Status: AC
  Filled 2010-08-14: qty 2

## 2010-08-14 MED ORDER — PROPOFOL 10 MG/ML IV EMUL
INTRAVENOUS | Status: AC
  Filled 2010-08-14: qty 20

## 2010-08-14 MED ORDER — ONDANSETRON HCL 4 MG/2ML IJ SOLN
INTRAMUSCULAR | Status: AC
  Filled 2010-08-14: qty 2

## 2010-08-14 MED ORDER — DIGOXIN 0.25 MG/ML IJ SOLN
0.2500 mg | Freq: Every day | INTRAMUSCULAR | Status: DC
  Administered 2010-08-15 – 2010-08-16 (×2): 0.25 mg via INTRAVENOUS
  Filled 2010-08-14 (×4): qty 1

## 2010-08-14 MED ORDER — FENTANYL CITRATE 0.05 MG/ML IJ SOLN
INTRAMUSCULAR | Status: AC
Start: 2010-08-14 — End: 2010-08-14
  Filled 2010-08-14: qty 2

## 2010-08-14 MED ORDER — METOPROLOL TARTRATE 25 MG PO TABS
25.0000 mg | ORAL_TABLET | Freq: Two times a day (BID) | ORAL | Status: DC
  Administered 2010-08-14 – 2010-08-16 (×5): 25 mg via ORAL
  Filled 2010-08-14 (×6): qty 1

## 2010-08-14 MED ORDER — POLYETHYLENE GLYCOL 3350 17 G PO PACK
17.0000 g | PACK | Freq: Every day | ORAL | Status: DC
  Administered 2010-08-14 – 2010-08-20 (×7): 17 g via ORAL
  Filled 2010-08-14 (×7): qty 1

## 2010-08-14 MED ORDER — METOPROLOL TARTRATE 1 MG/ML IV SOLN
5.0000 mg | Freq: Four times a day (QID) | INTRAVENOUS | Status: DC
  Administered 2010-08-14 – 2010-08-17 (×12): 5 mg via INTRAVENOUS
  Filled 2010-08-14 (×12): qty 5

## 2010-08-14 MED ORDER — LIDOCAINE HCL (CARDIAC) 20 MG/ML IV SOLN
INTRAVENOUS | Status: AC
  Filled 2010-08-14: qty 5

## 2010-08-14 MED ORDER — DIGOXIN 0.25 MG/ML IJ SOLN
0.2500 mg | Freq: Once | INTRAMUSCULAR | Status: AC
  Administered 2010-08-14: 0.25 mg via INTRAVENOUS
  Filled 2010-08-14 (×2): qty 1

## 2010-08-14 NOTE — Progress Notes (Signed)
Subjective:  C/o dizziness last night lasting minutes at a time. No c/p, sob, or peripheral edema.  Objective:  Vital Signs in the last 24 hours: Temp:  [97.3 F (36.3 C)-98.7 F (37.1 C)] 97.8 F (36.6 C) (07/13 1600) Pulse Rate:  [81-153] 117  (07/13 1500) Resp:  [13-31] 27  (07/13 1900) BP: (89-118)/(46-85) 104/61 mmHg (07/13 1800) SpO2:  [89 %-99 %] 97 % (07/13 1600)  Intake/Output from previous day: 07/12 0701 - 07/13 0700 In: 4020 [P.O.:1020; I.V.:3000] Out: 2953 [Urine:2800; Emesis/NG output:150; Stool:3] Intake/Output from this shift:    Physical Exam: Neck: no adenopathy, no carotid bruit, no JVD, supple, symmetrical, trachea midline and thyroid not enlarged, symmetric, no tenderness/mass/nodules Lungs: clear to auscultation bilaterally Heart: regular rate and rhythm, S1, S2 normal, no murmur, click, rub or gallop Abdomen: soft, non-tender; bowel sounds normal; no masses,  no organomegaly, 3rd trimester preg. Extremities: extremities normal, atraumatic, no cyanosis or edema and warm Pulses: 2+ and symmetric Skin: Skin color, texture, turgor normal. No rashes or lesions Neurologic: Grossly normal  Lab Results: No results found for this basename: WBC:2,HGB:2,PLT:2 in the last 72 hours  Basename 08/14/10 0505 08/13/10 0505  NA 135 137  K 3.7 3.4*  CL 104 106  CO2 24 21  GLUCOSE 88 96  BUN 4* 6  CREATININE 0.49* <0.47*   No results found for this basename: TROPONINI:2,CK,MB:2 in the last 72 hours Hepatic Function Panel No results found for this basename: PROT,ALBUMIN,AST,ALT,ALKPHOS,BILITOT,BILIDIR,IBILI in the last 72 hours No results found for this basename: CHOL in the last 72 hours No results found for this basename: PROTIME in the last 72 hours  Cardiac Studies: Atrial tachycardia 120-150/min  Assessment/Plan:  SVT - I will uptitrate her beta blocker and digoxin and check a digoxin level in the a.m. Continue I.V. Fluids for now.   LOS: 4 days     Lewayne Bunting 08/14/2010, 7:43 PM

## 2010-08-14 NOTE — Progress Notes (Signed)
Notified Dr. Jean Rosenthal Christell Constant of pt. Condition and received an order for an anti-emetic

## 2010-08-14 NOTE — Progress Notes (Signed)
  S: C/O constipation, dizziness  O: BP 97/52  Pulse 117  Temp(Src) 98 F (36.7 C) (Oral)  Resp 17  Ht 5\' 8"  (1.727 m)  Wt 119.659 kg (263 lb 12.8 oz)  BMI 40.11 kg/m2  SpO2 98%  LMP 10/23/2009   BJY:NWGNFAOZ: 110 bpm and Variability: Good {> 6 bpm) Toco: None SVE:  Deferred.  A/P- 30 y.o. admitted with SVT Preterm labor management: no treatment necessary Maternal heart rate trending down Dating:  [redacted]w[redacted]d PNL Needed:  N/A FWB:  Good. Continue current management.

## 2010-08-14 NOTE — Progress Notes (Signed)
After sched. Metoprolol. Pt. Resting, no c/o at this time. OB rapid response nurse in with pt. adjusting ext. fetal monitor

## 2010-08-14 NOTE — Progress Notes (Signed)
notified of "vagal" response to vommitting HR decreased but rhythm very irregular with 2nd degree heart block. HR back up to 160's in 30-60sec

## 2010-08-14 NOTE — Progress Notes (Signed)
Chest "discomfort" pt. Not sure if its "gas" pain 2/10 Pt. Positioned on side to have have her back rubbed and patted, burped after and states chest discomfort improving, then  Became nauseas

## 2010-08-14 NOTE — Progress Notes (Signed)
"  heaves"

## 2010-08-14 NOTE — Progress Notes (Signed)
Husband at bedside and very helpful with her care and assistance needs

## 2010-08-14 NOTE — Consult Note (Signed)
Improved maternal heart rate and response, with  two short episodes of "dizziness", not repeated.  Fetal heart rate in 120's with excellent accelerations and BTB variability. Continue present plan of care with initial goal of getting to 37 weeks.  Patient has a clinical indication for considering delivery before 39 weeks.

## 2010-08-14 NOTE — Progress Notes (Signed)
Sleeping, easily aroused, no c/o, drifted off back to sleep

## 2010-08-14 NOTE — Progress Notes (Signed)
Changed position, rested

## 2010-08-14 NOTE — Progress Notes (Signed)
Approx. 2300 0 7/12 pt. Experiencing severe dizziness while lying down with some mild chest discomfort / indigestion? that was followed by nausea and vommitting. Dr. Tamela Oddi notified and orders were received for Zofran and chest discomfort decreased after position change and back rub.  Pt. Also had a HR dec. Vagal response with the vommitting that revealed a 2nd degree type 2 HB and [facility] Fellow, Dr. Freida Busman was also notified. HR inc. Back up to 160's within 30-60 sec. Pt. Eventually got relief of nausea after vommitting and  Zofran. Chest discomfort also resolved after vommitting and she showed even more improvement of how she was feeling after her scheduled dose of metoprolol and finally fell asleep.

## 2010-08-15 LAB — DIGOXIN LEVEL: Digoxin Level: 0.5 ng/mL — ABNORMAL LOW (ref 0.8–2.0)

## 2010-08-15 NOTE — Progress Notes (Signed)
  [name] is a pulse rate today is down the region of 110-120 She is being followed by the cardiologist And this morning feels much feels much better than she did yesterday No change

## 2010-08-16 NOTE — Progress Notes (Signed)
  Vital signs normal No contractions Heart rate is stable overnight Legs negative Status unchanged

## 2010-08-17 ENCOUNTER — Other Ambulatory Visit: Payer: Self-pay | Admitting: Cardiology

## 2010-08-17 DIAGNOSIS — R000 Tachycardia, unspecified: Secondary | ICD-10-CM

## 2010-08-17 MED ORDER — DIGOXIN 250 MCG PO TABS: 0.2500 mg | ORAL_TABLET | Freq: Four times a day (QID) | ORAL | Status: DC

## 2010-08-17 MED ORDER — PRENATAL PLUS 27-1 MG PO TABS: 1.0000 | ORAL_TABLET | Freq: Every day | ORAL | Status: DC

## 2010-08-17 MED ORDER — SODIUM CHLORIDE 0.9 % IJ SOLN
3.0000 mL | Freq: Two times a day (BID) | INTRAMUSCULAR | Status: DC
  Administered 2010-08-17 – 2010-08-19 (×5): 3 mL via INTRAVENOUS

## 2010-08-17 MED ORDER — METOPROLOL TARTRATE 25 MG PO TABS
25.0000 mg | ORAL_TABLET | Freq: Four times a day (QID) | ORAL | Status: DC
  Administered 2010-08-17 – 2010-08-19 (×8): 25 mg via ORAL
  Filled 2010-08-17 (×9): qty 1

## 2010-08-17 MED ORDER — ACETAMINOPHEN 500 MG PO TABS: 500.0000 mg | ORAL_TABLET | Freq: Every day | ORAL | Status: DC

## 2010-08-17 MED ORDER — DIGOXIN 250 MCG PO TABS
0.2500 mg | ORAL_TABLET | Freq: Every day | ORAL | Status: DC
  Administered 2010-08-17 – 2010-08-18 (×2): 0.25 mg via ORAL
  Filled 2010-08-17 (×3): qty 1

## 2010-08-17 MED ORDER — SODIUM CHLORIDE 0.9 % IV SOLN: 250.0000 mL | INTRAVENOUS | Status: DC

## 2010-08-17 MED ORDER — METOPROLOL TARTRATE 25 MG PO TABS
25.0000 mg | ORAL_TABLET | Freq: Four times a day (QID) | ORAL | Status: DC
  Filled 2010-08-17 (×2): qty 1

## 2010-08-17 MED ORDER — DIGOXIN 250 MCG PO TABS: 250.0000 ug | ORAL_TABLET | Freq: Every day | ORAL | Status: DC

## 2010-08-17 MED ORDER — SODIUM CHLORIDE 0.9 % IJ SOLN
3.0000 mL | INTRAMUSCULAR | Status: DC | PRN
  Administered 2010-08-18 – 2010-08-19 (×2): 3 mL via INTRAVENOUS

## 2010-08-17 NOTE — Progress Notes (Signed)
Patient is stable this am, HR is in the 110s, still SVT.    HR 112, BP 107/60 General: NAD Neck: JVP 8-9 cm  Lungs: Clear to auscultation bilaterally with normal respiratory effort. CV: Nondisplaced PMI.  Heart tachy, regular S1/S2, no S3/S4,2/6 systolic murmur.  No peripheral edema.  No carotid bruit.  Normal pedal pulses.  Abdomen: Gravid, nontender  No current facility-administered medications for this visit.   No current outpatient prescriptions on file.   Facility-Administered Medications Ordered in Other Visits  Medication Dose Route Frequency Provider Last Rate Last Dose  . acetaminophen (TYLENOL) tablet 500 mg  500 mg Oral Daily Brock Bad, MD      . acetaminophen (TYLENOL) tablet 650 mg  650 mg Oral Q4H PRN [name], MD   650 mg at 08/13/10 1825  . calcium carbonate (TUMS - dosed in mg elemental calcium) chewable tablet 400 mg of elemental calcium  2 tablet Oral Q4H PRN [name], MD   400 mg of elemental calcium at 08/11/10 2027  . digoxin (LANOXIN) injection 0.25 mg  0.25 mg Intravenous Daily Lewayne Bunting, MD   0.25 mg at 08/16/10 1016  . digoxin (LANOXIN) tablet 250 mcg  250 mcg Oral Daily Brock Bad, MD      . docusate sodium (COLACE) capsule 100 mg  100 mg Oral Daily [name], MD   100 mg at 08/16/10 1014  . lactated ringers infusion   Intravenous Continuous Lewayne Bunting, MD 125 mL/hr at 08/17/10 0408    . metoprolol (LOPRESSOR) injection 5 mg  5 mg Intravenous Q6H Lewayne Bunting, MD   5 mg at 08/17/10 0610  . metoprolol tartrate (LOPRESSOR) tablet 25 mg  25 mg Oral Q12H Lewayne Bunting, MD   25 mg at 08/16/10 2149  . metoprolol tartrate (LOPRESSOR) tablet 25 mg  25 mg Oral QID Brock Bad, MD      . polyethylene glycol (MIRALAX / GLYCOLAX) packet 17 g  17 g Oral Daily [name], MD   17 g at 08/16/10 1014  . prenatal vitamin w/FE, FA (PRENATAL 1 + 1) 27-1 MG tablet 1 tablet  1 tablet Oral Daily [name], MD    1 tablet at 08/16/10 1014  . prenatal vitamin w/FE, FA (PRENATAL 1 + 1) 27-1 MG tablet 1 tablet  1 tablet Oral Daily Brock Bad, MD      . zolpidem (AMBIEN) tablet 10 mg  10 mg Oral QHS PRN [name], MD       A/P: 30 yo with SVT at [redacted] weeks gestation.  SVT continues with HR under better control at 110s-120s.  Plan is to get her to [redacted] weeks gestation (Wednesday) for delivery.  She has only had SVT with pregnancy so ablation may not be necessary.  However, tubal ligation would be suggested after this delivery.  Change metoprolol to po and digoxin to po.

## 2010-08-17 NOTE — Progress Notes (Signed)
  No complaints.  Feels better.  Pleased that heart rate is much lower.  Has more energy.                                              Afebrile, pulse 114, bp stable                                          EFM:  Reactive                                    A/P:  36 5/7 weeks.  SVT.  Stable.  Continue bedrest and management recommendations per MFM and Cardiology.

## 2010-08-18 NOTE — Consult Note (Signed)
Spiritual Care - Referral from RN. Visited w/patient and her husband.  Both were very pleasant and conversed easily.  Patient is very tired of being in hospital and is eager for baby to come.  She reports had similar issues when pregnant with her two girls, ages 59 & 6.  She became tearful talking about them - misses them.  Her husband and mother are looking after the children while she is in hospital - and they are brought to the hospital to visit.  Patient reports she attends a church in [place] but has not notified her clergy and does not wish to.  She prefers visits from family only while here.  Dory Horn, Chaplain

## 2010-08-18 NOTE — Progress Notes (Signed)
  No complaints.                                                                           Afebrile  BP and Pulse stable                                   Abdomen - nontender                                    EFM:  Reassuring.                                    A/P:  36 6/7 weeks.  SVT.  Continue bedrest.

## 2010-08-19 MED ORDER — CARBOPROST TROMETHAMINE 250 MCG/ML IM SOLN
INTRAMUSCULAR | Status: AC
  Filled 2010-08-19: qty 1

## 2010-08-19 MED ORDER — METOPROLOL SUCCINATE ER 25 MG PO TB24
25.0000 mg | ORAL_TABLET | Freq: Two times a day (BID) | ORAL | Status: DC
  Administered 2010-08-19 – 2010-08-20 (×3): 25 mg via ORAL
  Filled 2010-08-19 (×3): qty 1

## 2010-08-19 NOTE — Progress Notes (Signed)
  No complaints.  Resting well.  +FM.                            Afebrile  VSS  Pulse stable                            Abdomen - gravid, NT.                              EFM:  Reassuring.  Occ. Mild UC.  A/P:  37 weeks.  SVT.  Currently stable clinically with adjustment of meds. By Cardiology.  Discussed with Dr. Tona Sensing, MFM, and his recommendation is that if patient is clinically stable delivery could be done at 38 - 39 weeks.  The patient could be transferred to the Antenatal floor for further management.  Would not be prudent to dischage patient home prior to delivery because of noncompliance with meds and bedrest.

## 2010-08-20 ENCOUNTER — Encounter (HOSPITAL_COMMUNITY): Payer: Self-pay | Admitting: *Deleted

## 2010-08-20 MED ORDER — METOPROLOL SUCCINATE ER 25 MG PO TB24: 25.0000 mg | ORAL_TABLET | Freq: Two times a day (BID) | ORAL | Status: DC

## 2010-08-20 NOTE — Discharge Summary (Signed)
  Physician Discharge Summary  Patient ID: SITLALI KOERNER MRN: [id number] DOB/AGE: [birth year] 30 y.o.  Admit date: 08/10/2010 Discharge date: 08/20/2010  Admission Diagnoses: Pregnant, SVT  Discharge Diagnoses: Same. Active Problems:  Paroxysmal supraventricular tachycardia  DIZZINESS  Palpitations  Pregnancy complication   Discharged Condition: good  Hospital Course:  The patient was admitted in SVT with a ventricular rate of 160 bpm.  The fetal status remained reassuring.  Cardiology/MFM were consulted.  The patient's digoxin/metoprolol were titrated.  The patient was hydrated.  She was transitioned to oral medications.  The digoxin was discontinued. The patient was in NSR prior to discharge.  An U/S showed appropriate fetal growth.  Consults: cardiology and MFM  Significant Diagnostic Studies: labs: digoxin levels and OB U/S  Treatments: IV hydration and cardiac meds: metoprolol and digoxin  Discharge Exam: Blood pressure 116/55, pulse 92, temperature 97.5 F (36.4 C), temperature source Oral, resp. rate 22, height 5\' 8"  (1.727 m), weight 119.659 kg (263 lb 12.8 oz), last menstrual period 10/23/2009, SpO2 97.00%. General appearance: alert Cardio: regular rate and rhythm, S1, S2 normal, no murmur, click, rub or gallop  Disposition: Home or Self Care   Current Discharge Medication List    START taking these medications   Details  metoprolol succinate (TOPROL-XL) 25 MG 24 hr tablet Take 1 tablet (25 mg total) by mouth 2 (two) times daily. Qty: 60 tablet, Refills: 1      CONTINUE these medications which have NOT CHANGED   Details  acetaminophen (TYLENOL) 500 MG tablet Take 500 mg by mouth daily. For pain     prenatal vitamin w/FE, FA (PRENATAL 1 + 1) 27-1 MG TABS Take 1 tablet by mouth daily.        STOP taking these medications     digoxin (LANOXIN) 0.25 MG tablet      metoprolol tartrate (LOPRESSOR) 25 MG tablet        Follow-up Information    Follow up with  [name], MD. (IOL next week)    Contact information:   [address] [place] [address] [phone]          Signed: [name] 08/20/2010, 8:56 AM

## 2010-08-20 NOTE — Progress Notes (Signed)
D/c via wheelchair.  Stable condition, husband at side.

## 2010-08-20 NOTE — Progress Notes (Signed)
S: No complaints.  O: Blood pressure 116/55, pulse 92, temperature 98.4 F (36.9 C), temperature source Oral, resp. rate 21, height 5\' 8"  (1.727 m), weight 119.659 kg (263 lb 12.8 oz), last menstrual period 10/23/2009, SpO2 99.00%.   ZOX:WRUEAVWU: 140 bpm, Variability: Good {> 6 bpm) and Accelerations: Reactive Toco: None SVE: Deferred.  A/P- 30 y.o. admitted with SVT.  In NSR at present Dating:  [redacted]w[redacted]d D/C home today Schedule IOL at 38 weeks ROD: spontaneous vaginal

## 2010-08-26 ENCOUNTER — Encounter (HOSPITAL_COMMUNITY): Payer: Self-pay

## 2010-08-26 ENCOUNTER — Inpatient Hospital Stay (HOSPITAL_COMMUNITY)
Admission: RE | Admit: 2010-08-26 | Discharge: 2010-08-27 | DRG: 781 | Disposition: A | Discharge status: Home / Self Care | Payer: Medicaid Other | Source: Ambulatory Visit | Attending: Obstetrics & Gynecology | Admitting: Obstetrics & Gynecology

## 2010-08-26 DIAGNOSIS — I2510 Atherosclerotic heart disease of native coronary artery without angina pectoris: Principal | ICD-10-CM | POA: Diagnosis present

## 2010-08-26 DIAGNOSIS — I471 Supraventricular tachycardia, unspecified: Secondary | ICD-10-CM | POA: Diagnosis present

## 2010-08-26 LAB — CBC
HCT: 33.5 % — ABNORMAL LOW (ref 36.0–46.0)
MCHC: 32.5 g/dL (ref 30.0–36.0)
MCV: 83.8 fL (ref 78.0–100.0)
RDW: 15.5 % (ref 11.5–15.5)
WBC: 9.9 10*3/uL (ref 4.0–10.5)

## 2010-08-26 MED ORDER — IBUPROFEN 600 MG PO TABS: 600.0000 mg | ORAL_TABLET | Freq: Four times a day (QID) | ORAL | Status: DC | PRN

## 2010-08-26 MED ORDER — LIDOCAINE HCL (PF) 1 % IJ SOLN: 30.0000 mL | INTRAMUSCULAR | Status: DC | PRN

## 2010-08-26 MED ORDER — MISOPROSTOL 25 MCG QUARTER TABLET
25.0000 ug | ORAL_TABLET | Freq: Once | ORAL | Status: AC
  Administered 2010-08-26: 25 ug via VAGINAL
  Filled 2010-08-26: qty 0.25

## 2010-08-26 MED ORDER — LACTATED RINGERS IV SOLN
INTRAVENOUS | Status: DC
  Administered 2010-08-26: 16:00:00 via INTRAVENOUS

## 2010-08-26 MED ORDER — OXYCODONE-ACETAMINOPHEN 5-325 MG PO TABS: 2.0000 | ORAL_TABLET | ORAL | Status: DC | PRN

## 2010-08-26 MED ORDER — FLEET ENEMA 7-19 GM/118ML RE ENEM: 1.0000 | ENEMA | RECTAL | Status: DC | PRN

## 2010-08-26 MED ORDER — ACETAMINOPHEN 325 MG PO TABS
650.0000 mg | ORAL_TABLET | ORAL | Status: DC | PRN
  Administered 2010-08-26: 650 mg via ORAL
  Filled 2010-08-26: qty 2

## 2010-08-26 MED ORDER — TERBUTALINE SULFATE 1 MG/ML IJ SOLN: 0.2500 mg | Freq: Once | INTRAMUSCULAR | Status: AC | PRN

## 2010-08-26 MED ORDER — LACTATED RINGERS IV SOLN
500.0000 mL | INTRAVENOUS | Status: DC | PRN
  Administered 2010-08-27: 1000 mL via INTRAVENOUS

## 2010-08-26 MED ORDER — OXYTOCIN 20 UNITS IN LACTATED RINGERS INFUSION - SIMPLE
125.0000 mL/h | Freq: Once | INTRAVENOUS | Status: DC
  Filled 2010-08-26 (×2): qty 1000

## 2010-08-26 MED ORDER — ONDANSETRON HCL 4 MG/2ML IJ SOLN: 4.0000 mg | Freq: Four times a day (QID) | INTRAMUSCULAR | Status: DC | PRN

## 2010-08-26 MED ORDER — OXYTOCIN 20 UNITS IN LACTATED RINGERS INFUSION - SIMPLE
2.0000 mL/h | INTRAVENOUS | Status: DC
  Administered 2010-08-26: 8 [IU] via INTRAVENOUS
  Administered 2010-08-26: 2 mL/h via INTRAVENOUS
  Administered 2010-08-26: 12 [IU] via INTRAVENOUS
  Administered 2010-08-26: 10 [IU] via INTRAVENOUS
  Administered 2010-08-26: 24 mL/h via INTRAVENOUS
  Administered 2010-08-27: 2 mL/h via INTRAVENOUS

## 2010-08-26 MED ORDER — METOPROLOL SUCCINATE ER 25 MG PO TB24
25.0000 mg | ORAL_TABLET | Freq: Two times a day (BID) | ORAL | Status: DC
  Administered 2010-08-26 – 2010-08-27 (×3): 25 mg via ORAL
  Filled 2010-08-26 (×5): qty 1

## 2010-08-26 MED ORDER — CITRIC ACID-SODIUM CITRATE 334-500 MG/5ML PO SOLN: 30.0000 mL | ORAL | Status: DC | PRN

## 2010-08-26 NOTE — H&P (Signed)
[name] is Francis 30 y.o. female presenting for IOL. Maternal Medical History:  Reason for admission: IOL  Fetal activity: Perceived fetal activity is normal.    Prenatal complications: SVT    OB History    Grav Para Term Preterm Abortions TAB SAB Ect Mult Living   4 2 2  1  1   2      Past Medical History  Diagnosis Date  . Dizziness   . Palpitations   . SVT (supraventricular tachycardia)   . Angina    Past Surgical History  Procedure Date  . Dilation and curettage of uterus    Family History: family history includes Diabetes in her father, paternal grandmother, and unspecified family member; Hyperlipidemia in her mother; and Hypertension in her father. Social History:  reports that she has never smoked. She does not have any smokeless tobacco history on file. Her alcohol and drug histories not on file.  Review of Systems  Constitutional: Negative for fever.  Eyes: Negative for blurred vision.  Respiratory: Negative for shortness of breath.   Cardiovascular: Negative.   Gastrointestinal: Negative for vomiting.  Skin: Negative for rash.  Neurological: Negative for headaches.    Dilation: 1.5 Effacement (%): 40 Station: -3 Exam by:: D Herr rn Blood pressure 117/65, pulse 102, temperature 97.9 F (36.6 C), temperature source Oral, resp. rate 20, height 5\' 8"  (1.727 m), weight 123.378 kg (272 lb), last menstrual period 10/23/2009. Maternal Exam:  Uterine Assessment: Contraction strength is mild.  Contraction frequency is irregular.   Abdomen: Patient reports no abdominal tenderness. Fetal presentation: vertex  Introitus: not evaluated.     Fetal Exam Fetal Monitor Review: Variability: moderate (6-25 bpm).   Pattern: accelerations present and no decelerations.    Fetal State Assessment: Category I - tracings are normal.     Physical Exam  Constitutional: She appears well-developed.  HENT:  Head: Normocephalic.  Neck: Neck supple. No thyromegaly  present.  Cardiovascular: Normal rate, regular rhythm and normal heart sounds.  Exam reveals no gallop and no friction rub.   No murmur heard. Respiratory: Breath sounds normal.  GI: Soft. Bowel sounds are normal.  Skin: No rash noted.    Prenatal labs: ABO, Rh:   Antibody: Negative (01/25 0000) Rubella:   RPR: NON REACTIVE (07/09 1410)  HBsAg: Negative (01/25 0000)  HIV: Non-reactive (01/25 0000)  GBS:     Assessment/Plan: Multipara at 38 weeks, pregnancy complicated by paroxysmal SVT.  NSR at present/no symptoms.  Per Cardiology/MFM consultations IOL recommended. Anesthesiology aware of admission  Admit Pitocin Epidural Continue Metoprolol  [name] 08/26/2010, 9:17 AM

## 2010-08-26 NOTE — Progress Notes (Signed)
[name] is a 30 y.o. [phone] at [redacted]w[redacted]d by ultrasound admitted for induction of labor due to SVT.  Subjective:   Objective: BP 121/63  Pulse 90  Temp(Src) 98.2 F (36.8 C) (Axillary)  Resp 20  Ht 5\' 8"  (1.727 m)  Wt 123.378 kg (272 lb)  BMI 41.36 kg/m2  LMP 10/23/2009      FHT:  FHR: 140 bpm, variability: moderate,  accelerations:  Present,  decelerations:  Absent UC:   irregular, every 7 minutes SVE:   Dilation: 2 Effacement (%): Thick Station: -3 Exam by:: D Herr rn  Labs: Lab Results  Component Value Date   WBC 9.9 08/26/2010   HGB 10.9* 08/26/2010   HCT 33.5* 08/26/2010   MCV 83.8 08/26/2010   PLT 179 08/26/2010    Assessment / Plan: Induction of labor due to [facility] medical conditions,  progressing well on pitocin  Labor: early labor  Fetal Wellbeing:  Category I Pain Control:  Labor support without medications I/D:  n/a Anticipated MOD:  NSVD  JACKSON-MOORE,Marcea Rojek A 08/26/2010, 1:09 PM

## 2010-08-27 MED ORDER — MISOPROSTOL 25 MCG QUARTER TABLET
25.0000 ug | ORAL_TABLET | Freq: Once | ORAL | Status: AC
  Administered 2010-08-27: 25 ug via VAGINAL
  Filled 2010-08-27 (×2): qty 0.25

## 2010-08-27 MED ORDER — MISOPROSTOL 25 MCG QUARTER TABLET
25.0000 ug | ORAL_TABLET | Freq: Once | ORAL | Status: AC
  Administered 2010-08-27: 25 ug via VAGINAL
  Filled 2010-08-27: qty 0.25

## 2010-08-27 NOTE — Progress Notes (Signed)
ALIZ MERITT is a 30 y.o. [phone] at [redacted]w[redacted]d by ultrasound admitted for induction of labor due to h/o SVT.  Subjective: No complaints   Objective: BP 108/69  Pulse 85  Temp(Src) 98.4 F (36.9 C) (Oral)  Resp 18  Ht 5\' 8"  (1.727 m)  Wt 123.378 kg (272 lb)  BMI 41.36 kg/m2  SpO2 98%  LMP 10/23/2009      FHT:  FHR: 140 bpm, variability: moderate,  accelerations:  Present,  decelerations:  Absent UC:   irregular, every 7 minutes SVE:   Dilation: 2.5 Effacement (%): Thick Station: -3 Exam by:: [employer], RN   Labs: Lab Results  Component Value Date   WBC 9.9 08/26/2010   HGB 10.9* 08/26/2010   HCT 33.5* 08/26/2010   MCV 83.8 08/26/2010   PLT 179 08/26/2010    Assessment / Plan: Borderline Bishop's score  Labor: continue ripening  Fetal Wellbeing:  Category I  I/D:  n/a Anticipated MOD:  NSVD  JACKSON-MOORE,Jaslynne Dahan A 08/27/2010, 8:45 AM

## 2010-08-27 NOTE — Progress Notes (Signed)
Dr Clearance Coots and Dr Tamela Oddi notified of pt status, FHR tracing, UC freq, SVE, maternal VS. Order from Dr Tamela Oddi to D/C pt. Pt verbalized understanding and has no further questions. Informed to IOL for 08/29/10 @ 0830

## 2010-08-29 ENCOUNTER — Encounter (HOSPITAL_COMMUNITY): Payer: Self-pay | Admitting: Anesthesiology

## 2010-08-29 ENCOUNTER — Inpatient Hospital Stay (HOSPITAL_COMMUNITY)
Admission: RE | Admit: 2010-08-29 | Discharge: 2010-08-31 | DRG: 774 | Disposition: A | Discharge status: Home / Self Care | Payer: Medicaid Other | Source: Ambulatory Visit | Attending: Obstetrics & Gynecology | Admitting: Obstetrics & Gynecology

## 2010-08-29 ENCOUNTER — Encounter (HOSPITAL_COMMUNITY): Payer: Self-pay

## 2010-08-29 ENCOUNTER — Inpatient Hospital Stay (HOSPITAL_COMMUNITY): Payer: Medicaid Other | Admitting: Anesthesiology

## 2010-08-29 DIAGNOSIS — I2510 Atherosclerotic heart disease of native coronary artery without angina pectoris: Principal | ICD-10-CM | POA: Diagnosis present

## 2010-08-29 DIAGNOSIS — R002 Palpitations: Secondary | ICD-10-CM

## 2010-08-29 DIAGNOSIS — I471 Supraventricular tachycardia, unspecified: Secondary | ICD-10-CM

## 2010-08-29 DIAGNOSIS — R42 Dizziness and giddiness: Secondary | ICD-10-CM

## 2010-08-29 DIAGNOSIS — O2690 Pregnancy related conditions, unspecified, unspecified trimester: Secondary | ICD-10-CM

## 2010-08-29 DIAGNOSIS — I498 Other specified cardiac arrhythmias: Secondary | ICD-10-CM | POA: Diagnosis present

## 2010-08-29 LAB — RPR: RPR Ser Ql: NONREACTIVE

## 2010-08-29 LAB — CBC
MCH: 27.1 pg (ref 26.0–34.0)
Platelets: 181 10*3/uL (ref 150–400)
RBC: 4.21 MIL/uL (ref 3.87–5.11)
RDW: 15.3 % (ref 11.5–15.5)

## 2010-08-29 MED ORDER — PHENYLEPHRINE 40 MCG/ML (10ML) SYRINGE FOR IV PUSH (FOR BLOOD PRESSURE SUPPORT)
80.0000 ug | PREFILLED_SYRINGE | INTRAVENOUS | Status: DC | PRN
  Filled 2010-08-29 (×2): qty 5

## 2010-08-29 MED ORDER — EPHEDRINE 5 MG/ML INJ
10.0000 mg | INTRAVENOUS | Status: DC | PRN
  Administered 2010-08-29: 10 mg via INTRAVENOUS
  Filled 2010-08-29: qty 4

## 2010-08-29 MED ORDER — FENTANYL 2.5 MCG/ML BUPIVACAINE 1/10 % EPIDURAL INFUSION (WH - ANES)
14.0000 mL/h | INTRAMUSCULAR | Status: DC
  Administered 2010-08-29 (×2): 13 mL/h via EPIDURAL
  Administered 2010-08-29: 14 mL/h via EPIDURAL
  Filled 2010-08-29 (×3): qty 60

## 2010-08-29 MED ORDER — DIPHENHYDRAMINE HCL 50 MG/ML IJ SOLN: 12.5000 mg | INTRAMUSCULAR | Status: DC | PRN

## 2010-08-29 MED ORDER — LACTATED RINGERS IV SOLN
INTRAVENOUS | Status: DC
  Administered 2010-08-29 (×2): via INTRAVENOUS

## 2010-08-29 MED ORDER — PHENYLEPHRINE 40 MCG/ML (10ML) SYRINGE FOR IV PUSH (FOR BLOOD PRESSURE SUPPORT)
80.0000 ug | PREFILLED_SYRINGE | INTRAVENOUS | Status: DC | PRN
  Filled 2010-08-29: qty 5

## 2010-08-29 MED ORDER — OXYTOCIN 20 UNITS IN LACTATED RINGERS INFUSION - SIMPLE
1.0000 m[IU]/min | INTRAVENOUS | Status: DC
  Administered 2010-08-29: 10 m[IU]/min via INTRAVENOUS
  Administered 2010-08-29: 12 m[IU]/min via INTRAVENOUS
  Administered 2010-08-29: 14 m[IU]/min via INTRAVENOUS
  Administered 2010-08-29: 8 m[IU]/min via INTRAVENOUS
  Administered 2010-08-29: 333 m[IU]/min via INTRAVENOUS
  Administered 2010-08-29: 6 m[IU]/min via INTRAVENOUS
  Administered 2010-08-29: 4 m[IU]/min via INTRAVENOUS
  Administered 2010-08-29: 6 m[IU]/min via INTRAVENOUS
  Administered 2010-08-29: 2 m[IU]/min via INTRAVENOUS
  Administered 2010-08-29: 8 m[IU]/min via INTRAVENOUS

## 2010-08-29 MED ORDER — ACETAMINOPHEN 325 MG PO TABS
650.0000 mg | ORAL_TABLET | ORAL | Status: DC | PRN
  Administered 2010-08-29: 650 mg via ORAL
  Filled 2010-08-29: qty 2

## 2010-08-29 MED ORDER — OXYCODONE-ACETAMINOPHEN 5-325 MG PO TABS: 2.0000 | ORAL_TABLET | ORAL | Status: DC | PRN

## 2010-08-29 MED ORDER — FLEET ENEMA 7-19 GM/118ML RE ENEM: 1.0000 | ENEMA | RECTAL | Status: DC | PRN

## 2010-08-29 MED ORDER — LACTATED RINGERS IV SOLN: 500.0000 mL | Freq: Once | INTRAVENOUS | Status: DC

## 2010-08-29 MED ORDER — CITRIC ACID-SODIUM CITRATE 334-500 MG/5ML PO SOLN: 30.0000 mL | ORAL | Status: DC | PRN

## 2010-08-29 MED ORDER — METOPROLOL TARTRATE 25 MG PO TABS
25.0000 mg | ORAL_TABLET | Freq: Two times a day (BID) | ORAL | Status: DC
  Administered 2010-08-29 – 2010-08-31 (×5): 25 mg via ORAL
  Filled 2010-08-29 (×7): qty 1

## 2010-08-29 MED ORDER — LIDOCAINE HCL (PF) 1 % IJ SOLN
30.0000 mL | INTRAMUSCULAR | Status: DC | PRN
  Filled 2010-08-29: qty 30

## 2010-08-29 MED ORDER — ONDANSETRON HCL 4 MG/2ML IJ SOLN
4.0000 mg | Freq: Four times a day (QID) | INTRAMUSCULAR | Status: DC | PRN
  Administered 2010-08-29 (×2): 4 mg via INTRAVENOUS
  Filled 2010-08-29 (×2): qty 2

## 2010-08-29 MED ORDER — OXYTOCIN 20 UNITS IN LACTATED RINGERS INFUSION - SIMPLE
125.0000 mL/h | Freq: Once | INTRAVENOUS | Status: DC
  Filled 2010-08-29: qty 1000

## 2010-08-29 MED ORDER — IBUPROFEN 600 MG PO TABS
600.0000 mg | ORAL_TABLET | Freq: Four times a day (QID) | ORAL | Status: DC | PRN
  Filled 2010-08-29: qty 1

## 2010-08-29 MED ORDER — TERBUTALINE SULFATE 1 MG/ML IJ SOLN: 0.2500 mg | Freq: Once | INTRAMUSCULAR | Status: AC | PRN

## 2010-08-29 MED ORDER — EPHEDRINE 5 MG/ML INJ
10.0000 mg | INTRAVENOUS | Status: DC | PRN
  Filled 2010-08-29 (×2): qty 4

## 2010-08-29 MED ORDER — LACTATED RINGERS IV SOLN: 500.0000 mL | INTRAVENOUS | Status: DC | PRN

## 2010-08-29 NOTE — Anesthesia Preprocedure Evaluation (Signed)
Anesthesia Evaluation  Name, MR# and DOB Patient awake  General Assessment Comment  Reviewed: Allergy & Precautions, H&P  and Patient's Chart, lab work & pertinent test results  Airway Mallampati: IV TM Distance: >3 FB Neck ROM: full    Dental No notable dental hx    Pulmonaryneg pulmonary ROS    clear to auscultation  pulmonary exam normal   Cardiovascular + angina + dysrhythmias Supra Ventricular Tachycardia - Valvular Problems/Murmursregular Normal Lightheaded with SVTLightheaded with SVT:    Neuro/PsychNegative Neurological ROS Negative Psych ROS  GI/Hepatic/Renal negative GI ROS, negative Liver ROS, and negative Renal ROS (+)       Endo/Other  Negative Endocrine ROS (+)  Morbid obesity Abdominal   Musculoskeletal  Hematology negative hematology ROS (+)   Peds  Reproductive/Obstetrics (+) Pregnancy   Anesthesia Other Findings             Anesthesia Physical Anesthesia Plan  ASA: III  Anesthesia Plan: Epidural   Post-op Pain Management:    Induction:   Airway Management Planned:   Additional Equipment:   Intra-op Plan:   Post-operative Plan:   Informed Consent: I have reviewed the patients History and Physical, chart, labs and discussed the procedure including the risks, benefits and alternatives for the proposed anesthesia with the patient or authorized representative who has indicated his/her understanding and acceptance.     Plan Discussed with:   Anesthesia Plan Comments:         Anesthesia Quick Evaluation

## 2010-08-29 NOTE — H&P (Signed)
    [name] is a 30 y.o. female presenting for IOL.  Maternal Medical History:  Reason for admission: IOL  Fetal activity: Perceived fetal activity is normal.  Prenatal complications: SVT     OB History      Grav  Para  Term  Preterm  Abortions  TAB  SAB  Ect  Mult  Living      4  2  2   1   1    2          Past Medical History     Diagnosis  Date     .  Dizziness      .  Palpitations      .  SVT (supraventricular tachycardia)      .  Angina      Past Surgical History     Procedure  Date     .  Dilation and curettage of uterus      Family History: family history includes Diabetes in her father, paternal grandmother, and unspecified family member; Hyperlipidemia in her mother; and Hypertension in her father.  Social History: reports that she has never smoked. She does not have any smokeless tobacco history on file. Her alcohol and drug histories not on file.  Review of Systems  Constitutional: Negative for fever.  Eyes: Negative for blurred vision.  Respiratory: Negative for shortness of breath.  Cardiovascular: Negative.  Gastrointestinal: Negative for vomiting.  Skin: Negative for rash.  Neurological: Negative for headaches.  Dilation: 3.5 Effacement (%): 60 Cervical Position: Middle Station: -2 Presentation: Vertex Exam by:: Dr Tamela Oddi  BP 126/61  Pulse 100  Temp(Src) 97.8 F (36.6 C) (Oral)  Resp 20  Ht 5\' 8"  (1.727 m)  Wt 123.378 kg (272 lb)  BMI 41.36 kg/m2  LMP 10/23/2009, last menstrual period 10/23/2009.  Maternal Exam:  Uterine Assessment: Contraction strength is mild. Contraction frequency is irregular.  Abdomen: Patient reports no abdominal tenderness. Fetal presentation: vertex  Introitus: not evaluated.  Fetal Exam  Fetal Monitor Review: Variability: moderate (6-25 bpm).  Pattern: accelerations present and no decelerations.  Fetal State Assessment: Category I - tracings are normal.  Physical Exam  Constitutional: She appears  well-developed.  HENT:  Head: Normocephalic.  Neck: Neck supple. No thyromegaly present.  Cardiovascular: Normal rate, regular rhythm and normal heart sounds. Exam reveals no gallop and no friction rub.  No murmur heard.  Respiratory: Breath sounds normal.  GI: Soft. Bowel sounds are normal.  Skin: No rash noted.  Prenatal labs:  ABO, Rh:  Antibody: Negative (01/25 0000)  Rubella:  RPR: NON REACTIVE (07/09 1410)  HBsAg: Negative (01/25 0000)  HIV: Non-reactive (01/25 0000)  GBS:  Assessment/Plan:  Multipara at 38+ weeks, pregnancy complicated by paroxysmal SVT. NSR at present/no symptoms. Per Cardiology/MFM consultations IOL recommended.  Anesthesiology aware of admission  Admit  Pitocin  Epidural  Continue Metoprolol  JACKSON-MOORE,Kaylei Frink A  08/26/2010, 9:17 AM

## 2010-08-29 NOTE — Anesthesia Procedure Notes (Signed)
Epidural Patient location during procedure: OB Start time: 08/29/2010 3:30 PM  Staffing Anesthesiologist: Brayton Caves R Performed by: anesthesiologist   Preanesthetic Checklist Completed: patient identified, site marked, surgical consent, pre-op evaluation, timeout performed, IV checked, risks and benefits discussed and monitors and equipment checked  Epidural Patient position: sitting Prep: site prepped and draped and DuraPrep Patient monitoring: continuous pulse ox and blood pressure Approach: midline Injection technique: LOR saline  Needle:  Needle type: Tuohy  Needle gauge: 17 G Needle length: 9 cm Needle insertion depth: 5 cm cm Catheter type: closed end flexible Catheter size: 19 Gauge Catheter at skin depth: 10 cm Test dose: negative  Assessment Events: blood not aspirated, injection not painful, no injection resistance, negative IV test and no paresthesia  Additional Notes Patient identified.  Risk benefits discussed including failed block, incomplete pain control, headache, nerve damage, paralysis, blood pressure changes, nausea, vomiting, reactions to medication both toxic or allergic, and postpartum back pain.  Patient expressed understanding and wished to proceed.  All questions were answered.  Sterile technique used throughout procedure and epidural site dressed with sterile barrier dressing. No paresthesia or other complications noted. 5 minutes prior to starting epidural infusion of fentanyl and bupivicaine, the epidural was loaded with 9 ml of 1.5% lidocaine in three separate doses.  The patient did not experience any signs of intravascular injection such as tinnitus or metallic taste in mouth nor signs of intrathecal spread such as rapid motor block. Please see nursing notes for vital signs.

## 2010-08-30 LAB — CBC
HCT: 31.8 % — ABNORMAL LOW (ref 36.0–46.0)
Hemoglobin: 10.2 g/dL — ABNORMAL LOW (ref 12.0–15.0)
MCHC: 32.1 g/dL (ref 30.0–36.0)
RBC: 3.81 MIL/uL — ABNORMAL LOW (ref 3.87–5.11)

## 2010-08-30 MED ORDER — DIPHENHYDRAMINE HCL 25 MG PO CAPS: 25.0000 mg | ORAL_CAPSULE | Freq: Four times a day (QID) | ORAL | Status: DC | PRN

## 2010-08-30 MED ORDER — LANOLIN HYDROUS EX OINT: TOPICAL_OINTMENT | CUTANEOUS | Status: DC | PRN

## 2010-08-30 MED ORDER — BENZOCAINE-MENTHOL 20-0.5 % EX AERO
INHALATION_SPRAY | CUTANEOUS | Status: AC
  Administered 2010-08-30: 1 via TOPICAL
  Filled 2010-08-30: qty 56

## 2010-08-30 MED ORDER — WITCH HAZEL-GLYCERIN EX PADS: MEDICATED_PAD | CUTANEOUS | Status: DC | PRN

## 2010-08-30 MED ORDER — SODIUM CHLORIDE 0.9 % IJ SOLN: 3.0000 mL | INTRAMUSCULAR | Status: DC | PRN

## 2010-08-30 MED ORDER — MEDROXYPROGESTERONE ACETATE 150 MG/ML IM SUSP: 150.0000 mg | INTRAMUSCULAR | Status: DC | PRN

## 2010-08-30 MED ORDER — OXYTOCIN 20 UNITS IN LACTATED RINGERS INFUSION - SIMPLE: 125.0000 mL/h | INTRAVENOUS | Status: DC | PRN

## 2010-08-30 MED ORDER — TETANUS-DIPHTH-ACELL PERTUSSIS 5-2.5-18.5 LF-MCG/0.5 IM SUSP: 0.5000 mL | Freq: Once | INTRAMUSCULAR | Status: DC

## 2010-08-30 MED ORDER — ONDANSETRON HCL 4 MG/2ML IJ SOLN: 4.0000 mg | INTRAMUSCULAR | Status: DC | PRN

## 2010-08-30 MED ORDER — MEASLES, MUMPS & RUBELLA VAC ~~LOC~~ INJ
0.5000 mL | INJECTION | Freq: Once | SUBCUTANEOUS | Status: DC
  Filled 2010-08-30: qty 0.5

## 2010-08-30 MED ORDER — ONDANSETRON HCL 4 MG PO TABS: 4.0000 mg | ORAL_TABLET | ORAL | Status: DC | PRN

## 2010-08-30 MED ORDER — ZOLPIDEM TARTRATE 5 MG PO TABS: 5.0000 mg | ORAL_TABLET | Freq: Every evening | ORAL | Status: DC | PRN

## 2010-08-30 MED ORDER — IBUPROFEN 600 MG PO TABS
600.0000 mg | ORAL_TABLET | Freq: Four times a day (QID) | ORAL | Status: DC
  Administered 2010-08-30 – 2010-08-31 (×7): 600 mg via ORAL
  Filled 2010-08-30 (×6): qty 1

## 2010-08-30 MED ORDER — SODIUM CHLORIDE 0.9 % IJ SOLN: 3.0000 mL | Freq: Two times a day (BID) | INTRAMUSCULAR | Status: DC

## 2010-08-30 MED ORDER — SODIUM CHLORIDE 0.9 % IV SOLN: 250.0000 mL | INTRAVENOUS | Status: DC

## 2010-08-30 MED ORDER — SENNOSIDES-DOCUSATE SODIUM 8.6-50 MG PO TABS
1.0000 | ORAL_TABLET | Freq: Every day | ORAL | Status: DC
  Administered 2010-08-30: 2 via ORAL

## 2010-08-30 MED ORDER — PRENATAL PLUS 27-1 MG PO TABS
1.0000 | ORAL_TABLET | Freq: Every day | ORAL | Status: DC
  Administered 2010-08-30 – 2010-08-31 (×2): 1 via ORAL
  Filled 2010-08-30 (×2): qty 1

## 2010-08-30 MED ORDER — BENZOCAINE-MENTHOL 20-0.5 % EX AERO
1.0000 "application " | INHALATION_SPRAY | CUTANEOUS | Status: DC | PRN
  Administered 2010-08-30: 1 via TOPICAL

## 2010-08-30 NOTE — Discharge Summary (Signed)
Physician Discharge Summary  Patient ID: FERNE ELLINGWOOD MRN: [id number] DOB/AGE: 31-Jul-1980 30 y.o.  Admit date: 08/26/2010 Discharge date: 08/30/2010  Admission Diagnoses:  Maternal SVT, for IOL  Discharge Diagnoses:  Maternal SVT, failed IOL attempt  Discharged Condition: stable  Hospital Course: The cervix was ripened.  There was no response to Pitocin.  Serial attempt made.  The patient remained in NSR.  The FHT was c/w FWB.  Consults: none  Significant Diagnostic Studies: none  Treatments: Cervical ripening with prostaglandin/ IV Pitocin  Discharge Exam: Blood pressure 121/55, pulse 88, temperature 98.5 F (36.9 C), temperature source Oral, resp. rate 18, height 5\' 8"  (1.727 m), weight 123.378 kg (272 lb), last menstrual period 10/23/2009, SpO2 98.00%, unknown if currently breastfeeding. General appearance: alert Cardio: regular rate and rhythm, S1, S2 normal, no murmur, click, rub or gallop GI: soft, non-tender; bowel sounds normal; no masses,  no organomegaly  Disposition: Home or Self Care    Discharge Orders    Future Orders Please Complete By Expires   HIV antibody      Comments:   This external order was created through the Results Console.   Rubella antibody, IgM      Comments:   This external order was created through the Results Console.   Hepatitis B surface antigen      Comments:   This external order was created through the Results Console.   RPR      Comments:   This external order was created through the Results Console.   Type and screen      Comments:   This external order was created through the Results Console.   ABO/Rh      Comments:   This external order was created through the Results Console.   ABO/Rh      Comments:   This external order was created through the Results Console.     Discharge Medication List as of 08/27/2010  6:24 PM    CONTINUE these medications which have NOT CHANGED   Details  prenatal vitamin w/FE, FA (PRENATAL 1  + 1) 27-1 MG TABS Take 1 tablet by mouth daily.  , Until Discontinued, Historical Med    acetaminophen (TYLENOL) 500 MG tablet Take 500 mg by mouth daily. For pain , Until Discontinued, Historical Med    digoxin (LANOXIN) 0.25 MG tablet Take 250 mcg by mouth daily.  , Until Discontinued, Historical Med    metoprolol succinate (TOPROL-XL) 25 MG 24 hr tablet Take 1 tablet (25 mg total) by mouth 2 (two) times daily., Starting 08/20/2010, Until Fri 08/20/11, Print       Follow-up Information    Follow up with [facility] on 08/29/2010. (scheduled for induction 08/29/10 @ 0830)    Contact information:   [address] [place] [address] [phone]         Signed: [name] 08/30/2010, 3:24 AM

## 2010-08-30 NOTE — Progress Notes (Signed)
Mother states she breastfed with other 2 children only 2-6 weeks and states she never made enough milk, she supplemented with formula with each one very early. Teaching about supply and demand. Mother very receptive. Assisted with latch in football hold. obs good flow of colostrum , 15-20 mins on first breast and then 10-15 on 2nd. Mother encouraged to feed infant on cue instead of giving bottle. Discussed pumping to increase milk volume if needed.

## 2010-08-30 NOTE — Anesthesia Postprocedure Evaluation (Signed)
  Anesthesia Post-op Note  Patient: [name]   Patient's cardiopulmonary status is stable Patient's level of consciousness: sedate but responsive verbally Pain and nausea are all reasonably controlled No anesthetic complications apparent at this time No follow up care necessary at this time

## 2010-08-30 NOTE — Progress Notes (Signed)
  Post Partum Day 1 S/P spontaneous vaginal RH status/Rubella reviewed.  Feeding: unknown Subjective: No HA, SOB, CP, F/C, breast symptoms. Normal vaginal bleeding, no clots.     Objective: BP 117/57  Pulse 56  Temp(Src) 98.2 F (36.8 C) (Oral)  Resp 18  Ht 5\' 8"  (1.727 m)  Wt 123.378 kg (272 lb)  BMI 41.36 kg/m2  LMP 10/23/2009  Breastfeeding? Unknown   Physical Exam:  General: alert Lochia: appropriate Uterine Fundus: firm DVT Evaluation: No evidence of DVT seen on physical exam. Ext: No c/c/e  Basename 08/30/10 0515 08/29/10 0937  HGB 10.2* 11.4*  HCT 31.8* 35.2*      Assessment/Plan: 30 y.o.  PPD #1 .  normal postpartum exam Continue current postpartum care  Ambulate   LOS: 1 day   Francis,Hannah Ausley A 08/30/2010, 9:27 AM

## 2010-08-30 NOTE — Progress Notes (Signed)
SVD of viable female at 2307

## 2010-08-31 MED ORDER — OXYCODONE-ACETAMINOPHEN 5-325 MG PO TABS
2.0000 | ORAL_TABLET | ORAL | Status: DC | PRN
  Administered 2010-08-31: 1 via ORAL
  Filled 2010-08-31: qty 2

## 2010-08-31 MED ORDER — OXYCODONE-ACETAMINOPHEN 5-325 MG PO TABS: 2.0000 | ORAL_TABLET | ORAL | Status: AC | PRN

## 2010-08-31 MED ORDER — IBUPROFEN 600 MG PO TABS: 600.0000 mg | ORAL_TABLET | Freq: Four times a day (QID) | ORAL | Status: DC

## 2010-08-31 NOTE — Anesthesia Postprocedure Evaluation (Signed)
  Anesthesia Post-op Note  Patient: [name]  Procedure(s) Performed: * No procedures listed *  Patient [phone]  Anesthesia Type: Epidural  Level of Consciousness: awake  Airway and Oxygen Therapy: Patient Spontanous Breathing    Post-op Assessment: Post-op Vital signs reviewed  Post-op Vital Signs: Reviewed  Complications: No apparent anesthesia complications

## 2010-08-31 NOTE — Addendum Note (Signed)
Addendum  created 08/31/10 1119 by Randa Spike   Modules edited:Charges VN, Notes Section

## 2010-08-31 NOTE — Progress Notes (Signed)
Post Partum Day 2 Subjective: no complaints, up ad lib, voiding and tolerating PO  Objective: Blood pressure 113/75, pulse 78, temperature 98.2 F (36.8 C), temperature source Oral, resp. rate 18, height 5\' 8"  (1.727 m), weight 123.378 kg (272 lb), last menstrual period 10/23/2009, unknown if currently breastfeeding.  Physical Exam:  General: alert and no distress Lochia: appropriate Uterine Fundus: firm Incision: n/a DVT Evaluation: No evidence of DVT seen on physical exam.   Basename 08/30/10 0515 08/29/10 0937  HGB 10.2* 11.4*  HCT 31.8* 35.2*    Assessment/Plan: Discharge home   LOS: 2 days   HARPER,CHARLES A 08/31/2010, 9:25 AM

## 2010-08-31 NOTE — Addendum Note (Signed)
Addendum  created 08/31/10 1119 by Marinna Blane D Itha Kroeker   Modules edited:Charges VN, Notes Section    

## 2010-08-31 NOTE — Progress Notes (Signed)
UR chart review completed.  

## 2010-08-31 NOTE — Discharge Instructions (Addendum)
After Your Delivery Discharge Instructions  After Discharge Orders: Call office for appt. 6 weeks.  Current Discharge Medication List    START taking these medications   Details  ibuprofen (ADVIL,MOTRIN) 600 MG tablet Take 1 tablet (600 mg total) by mouth every 6 (six) hours. Qty: 30 tablet, Refills: 5    oxyCODONE-acetaminophen (PERCOCET) 5-325 MG per tablet Take 2 tablets by mouth every 3 (three) hours as needed (for pain scale > 4). Qty: 40 tablet, Refills: 0      CONTINUE these medications which have NOT CHANGED   Details  prenatal vitamin w/FE, FA (PRENATAL 1 + 1) 27-1 MG TABS Take 1 tablet by mouth daily.     metoprolol succinate (TOPROL-XL) 25 MG 24 hr tablet Take 1 tablet (25 mg total) by mouth 2 (two) times daily. Qty: 60 tablet, Refills: 1      STOP taking these medications     metoprolol tartrate (LOPRESSOR) 25 MG tablet Comments:  Reason for Stopping:       acetaminophen (TYLENOL) 500 MG tablet Comments:  Reason for Stopping:       digoxin (LANOXIN) 0.25 MG tablet Comments:  Reason for Stopping:          Medical equipment: none   Call the Physician with any C-Section signs and symptoms:  n/a  Warning signs regarding incision:  "Popping" of stitches or staples  Foul smelling discharge or pus  More redness or streaks around incision than before  Incision care:  No tub baths until OK'd by your Physician or Midwif   After your delivery - signs and symptoms to watch for:  Fever - Oral temperature greater than 100.4 degrees Fahrenheit  Foul-smelling vaginal discharge  Headache unrelieved by "pain meds"  Difficulty urinating  Breasts reddened, hard, hot to the touch  Nipple discharge which is foul-smelling or contains pus  Increased pain at the site of the episiotomy  Difficulty breathing with or without chest pain  New calf pain especially if only on one side  Sudden, continuing increased vaginal bleeding with or without  clots  Unrelieved feelings of:  Inability to cope  Sadness  Anxiety  Lack of interest in baby  Insomnia  Crying   What to do at home:  See patient education handouts for full information  Resume activity gradually   Don't lift anything heavier than baby and carrier until OK'd by your Physician or Midwife  No sex until OK'd by your Physician or Midwife  Take care of yourself by sleeping/resting as much as possible  Eat regular nutritious meals  Let someone else care for you, your baby, and housework as much as possible   Take pain medication as prescribed whenever you need them  Wear compression stockings if prescribed   To avoid/relieve constipation take stool softeners if advised   Drink lots of water/fruit juices  Increase fiber in your diet  Breast care: Wear support bra 24/7; use lanolin ointment/cream as needed   Refer to Newborn Discharge Instructions for problems or follow-up regarding nursing

## 2010-08-31 NOTE — Discharge Summary (Signed)
Obstetric Discharge Summary Reason for Admission: induction of labor Prenatal Procedures: ultrasound Intrapartum Procedures: spontaneous vaginal delivery Postpartum Procedures: none Complications-Operative and Postpartum: none Hemoglobin  Date Value Range Status  08/30/2010 10.2* 12.0-15.0 (g/dL) Final     HCT  Date Value Range Status  08/30/2010 31.8* 36.0-46.0 (%) Final    Discharge Diagnoses: Term Pregnancy-delivered  Discharge Information: Date: 08/31/2010 Activity: unrestricted Diet: routine Medications: PNV, Ibuprophen and Percocet Condition: stable Instructions: refer to practice specific booklet and routine after vaginal delivery. Discharge to: home Follow-up Information    Follow up with [name], MD. Make an appointment in 6 weeks.   Contact information:   [address] [place] [address] [phone]          Newborn Data: Live born female  Birth Weight: 8 lb 7.3 oz (3835 g) APGAR: 9, 9  Home with mother.  Anneli Bing A 08/31/2010, 9:26 AM

## 2010-08-31 NOTE — Progress Notes (Signed)
Mother is ready for discharge. Just fed the baby. Third child. Nursing well, colostrum present but gave formula for "concerns that baby did not get enough". Mother states she has never made enough milk for her babies and had to pump and add to formula. She has had minimum breast changes. Encourage exclusive breast feeding and post pumping to stimulate milk production. Reviewed use of hand pump. Mother encouraged to get lactation follow up to assess for adequate milk supply due to past history.  Mother informed of post-discharge support and given phone number to the lactation department, including services for phone call assistance; out-patient appointments; and breastfeeding support group. List of other breastfeeding resources in the community given in the handout. Encouraged mother to call for problems or concerns related to breastfeeding.

## 2010-09-22 ENCOUNTER — Encounter: Payer: Self-pay | Admitting: Internal Medicine

## 2010-09-28 ENCOUNTER — Other Ambulatory Visit: Payer: Self-pay | Admitting: Obstetrics & Gynecology

## 2010-09-29 ENCOUNTER — Encounter (HOSPITAL_COMMUNITY): Payer: Self-pay

## 2010-09-29 ENCOUNTER — Encounter (HOSPITAL_COMMUNITY)
Admission: RE | Admit: 2010-09-29 | Discharge: 2010-09-29 | Disposition: A | Discharge status: Home / Self Care | Payer: Medicaid Other | Source: Ambulatory Visit | Attending: Obstetrics & Gynecology | Admitting: Obstetrics & Gynecology

## 2010-09-29 LAB — CBC
HCT: 41.9 % (ref 36.0–46.0)
MCH: 26.6 pg (ref 26.0–34.0)
MCV: 82 fL (ref 78.0–100.0)
Platelets: 195 10*3/uL (ref 150–400)
RBC: 5.11 MIL/uL (ref 3.87–5.11)

## 2010-09-29 NOTE — Patient Instructions (Addendum)
[name]  09/29/2010   Your procedure is scheduled on:  10/01/10  Enter through the Main Entrance of [facility] at 2:15PM.  Pick up the phone at the desk and dial [phone].   Call this number if you have problems the morning of surgery: [phone]   Remember:   Do not eat food:After Midnight.  Do not drink clear liquids: 4 Hours before arrival.  Take these medicines the morning of surgery with A SIP OF WATER: NA   Do not wear jewelry, make-up or nail polish.  Do not wear lotions, powders, or perfumes. You may wear deodorant.  Do not shave 48 hours prior to surgery.  Do not bring valuables to the hospital.  Contacts, dentures or bridgework may not be worn into surgery.  Leave suitcase in the car. After surgery it may be brought to your room.  For patients admitted to the hospital, checkout time is 11:00 AM the day of discharge.   Patients discharged the day of surgery will not be allowed to drive home.  Name and phone number of your driver: undecided  Special Instructions: CHG Shower Use Special Wash: 1/2 bottle night before surgery and 1/2 bottle morning of surgery.   Please read over the following fact sheets that you were given: MRSA Information

## 2010-09-29 NOTE — Pre-Procedure Instructions (Signed)
EKG of 08/10/10 accepted by Dr. Arby Barrette

## 2010-10-01 ENCOUNTER — Encounter (HOSPITAL_COMMUNITY): Payer: Self-pay | Admitting: Anesthesiology

## 2010-10-01 ENCOUNTER — Encounter (HOSPITAL_COMMUNITY)
Admission: RE | Disposition: A | Discharge status: Home / Self Care | Payer: Self-pay | Source: Ambulatory Visit | Attending: Obstetrics & Gynecology

## 2010-10-01 ENCOUNTER — Ambulatory Visit (HOSPITAL_COMMUNITY): Payer: Medicaid Other | Admitting: Anesthesiology

## 2010-10-01 ENCOUNTER — Encounter (HOSPITAL_COMMUNITY): Payer: Self-pay | Admitting: *Deleted

## 2010-10-01 ENCOUNTER — Ambulatory Visit (HOSPITAL_COMMUNITY)
Admission: RE | Admit: 2010-10-01 | Discharge: 2010-10-01 | Disposition: A | Discharge status: Home / Self Care | Payer: Medicaid Other | Source: Ambulatory Visit | Attending: Obstetrics & Gynecology | Admitting: Obstetrics & Gynecology

## 2010-10-01 ENCOUNTER — Encounter (HOSPITAL_COMMUNITY): Payer: Self-pay | Admitting: Obstetrics & Gynecology

## 2010-10-01 DIAGNOSIS — I471 Supraventricular tachycardia: Secondary | ICD-10-CM

## 2010-10-01 DIAGNOSIS — Z302 Encounter for sterilization: Secondary | ICD-10-CM | POA: Insufficient documentation

## 2010-10-01 DIAGNOSIS — R002 Palpitations: Secondary | ICD-10-CM

## 2010-10-01 DIAGNOSIS — O2690 Pregnancy related conditions, unspecified, unspecified trimester: Secondary | ICD-10-CM

## 2010-10-01 DIAGNOSIS — R42 Dizziness and giddiness: Secondary | ICD-10-CM

## 2010-10-01 DIAGNOSIS — Z641 Problems related to multiparity: Secondary | ICD-10-CM | POA: Insufficient documentation

## 2010-10-01 HISTORY — PX: LAPAROSCOPIC TUBAL LIGATION: SHX1937

## 2010-10-01 LAB — PREGNANCY, URINE: Preg Test, Ur: NEGATIVE

## 2010-10-01 SURGERY — LIGATION, FALLOPIAN TUBE, LAPAROSCOPIC
Anesthesia: General | Laterality: Bilateral | Wound class: Clean Contaminated

## 2010-10-01 MED ORDER — KETOROLAC TROMETHAMINE 30 MG/ML IJ SOLN
INTRAMUSCULAR | Status: DC | PRN
  Administered 2010-10-01: 30 mg via INTRAVENOUS

## 2010-10-01 MED ORDER — DEXAMETHASONE SODIUM PHOSPHATE 10 MG/ML IJ SOLN
INTRAMUSCULAR | Status: AC
  Filled 2010-10-01: qty 1

## 2010-10-01 MED ORDER — GLYCOPYRROLATE 0.2 MG/ML IJ SOLN
INTRAMUSCULAR | Status: AC
  Filled 2010-10-01: qty 1

## 2010-10-01 MED ORDER — ONDANSETRON HCL 4 MG/2ML IJ SOLN
INTRAMUSCULAR | Status: AC
  Administered 2010-10-01: 4 mg via INTRAVENOUS
  Filled 2010-10-01: qty 2

## 2010-10-01 MED ORDER — ONDANSETRON HCL 4 MG/2ML IJ SOLN
4.0000 mg | Freq: Once | INTRAMUSCULAR | Status: AC
  Administered 2010-10-01: 4 mg via INTRAVENOUS

## 2010-10-01 MED ORDER — MIDAZOLAM HCL 2 MG/2ML IJ SOLN
INTRAMUSCULAR | Status: AC
  Filled 2010-10-01: qty 2

## 2010-10-01 MED ORDER — KETOROLAC TROMETHAMINE 30 MG/ML IJ SOLN
INTRAMUSCULAR | Status: AC
  Filled 2010-10-01: qty 1

## 2010-10-01 MED ORDER — FENTANYL CITRATE 0.05 MG/ML IJ SOLN
INTRAMUSCULAR | Status: DC | PRN
  Administered 2010-10-01: 150 ug via INTRAVENOUS
  Administered 2010-10-01: 100 ug via INTRAVENOUS

## 2010-10-01 MED ORDER — ROCURONIUM BROMIDE 100 MG/10ML IV SOLN
INTRAVENOUS | Status: DC | PRN
  Administered 2010-10-01: 25 mg via INTRAVENOUS
  Administered 2010-10-01: 5 mg via INTRAVENOUS

## 2010-10-01 MED ORDER — PROMETHAZINE HCL 25 MG RE SUPP
25.0000 mg | RECTAL | Status: DC | PRN
  Administered 2010-10-01: 25 mg via RECTAL

## 2010-10-01 MED ORDER — METOCLOPRAMIDE HCL 5 MG/ML IJ SOLN
INTRAMUSCULAR | Status: AC
  Filled 2010-10-01: qty 2

## 2010-10-01 MED ORDER — BUPIVACAINE HCL (PF) 0.25 % IJ SOLN
INTRAMUSCULAR | Status: DC | PRN
  Administered 2010-10-01: 3 mL

## 2010-10-01 MED ORDER — FENTANYL CITRATE 0.05 MG/ML IJ SOLN
INTRAMUSCULAR | Status: AC
  Filled 2010-10-01: qty 5

## 2010-10-01 MED ORDER — NEOSTIGMINE METHYLSULFATE 1 MG/ML IJ SOLN
INTRAMUSCULAR | Status: DC | PRN
  Administered 2010-10-01: 5 mg via INTRAMUSCULAR

## 2010-10-01 MED ORDER — DEXAMETHASONE SODIUM PHOSPHATE 4 MG/ML IJ SOLN
INTRAMUSCULAR | Status: DC | PRN
  Administered 2010-10-01: 10 mg via INTRAVENOUS

## 2010-10-01 MED ORDER — NEOSTIGMINE METHYLSULFATE 1 MG/ML IJ SOLN
INTRAMUSCULAR | Status: AC
  Filled 2010-10-01: qty 10

## 2010-10-01 MED ORDER — METOCLOPRAMIDE HCL 5 MG/ML IJ SOLN
10.0000 mg | Freq: Once | INTRAMUSCULAR | Status: AC
  Administered 2010-10-01: 10 mg via INTRAVENOUS

## 2010-10-01 MED ORDER — GLYCOPYRROLATE 0.2 MG/ML IJ SOLN
INTRAMUSCULAR | Status: DC | PRN
  Administered 2010-10-01: 1 mg via INTRAVENOUS

## 2010-10-01 MED ORDER — OXYCODONE-ACETAMINOPHEN 5-325 MG PO TABS: 2.0000 | ORAL_TABLET | Freq: Four times a day (QID) | ORAL | Status: AC | PRN

## 2010-10-01 MED ORDER — LACTATED RINGERS IV SOLN
INTRAVENOUS | Status: DC
  Administered 2010-10-01: 1000 mL via INTRAVENOUS
  Administered 2010-10-01: 17:00:00 via INTRAVENOUS

## 2010-10-01 MED ORDER — LIDOCAINE HCL (CARDIAC) 20 MG/ML IV SOLN
INTRAVENOUS | Status: DC | PRN
  Administered 2010-10-01: 80 mg via INTRAVENOUS

## 2010-10-01 MED ORDER — SUCCINYLCHOLINE CHLORIDE 20 MG/ML IJ SOLN
INTRAMUSCULAR | Status: DC | PRN
  Administered 2010-10-01: 80 mg via INTRAVENOUS

## 2010-10-01 MED ORDER — PROMETHAZINE HCL 25 MG RE SUPP
RECTAL | Status: AC
  Administered 2010-10-01: 25 mg via RECTAL
  Filled 2010-10-01: qty 1

## 2010-10-01 MED ORDER — PROPOFOL 10 MG/ML IV EMUL
INTRAVENOUS | Status: DC | PRN
  Administered 2010-10-01: 200 mg via INTRAVENOUS

## 2010-10-01 MED ORDER — ONDANSETRON HCL 4 MG/2ML IJ SOLN
INTRAMUSCULAR | Status: DC | PRN
  Administered 2010-10-01: 4 mg via INTRAVENOUS

## 2010-10-01 SURGICAL SUPPLY — 13 items
CATH ROBINSON RED A/P 16FR (CATHETERS) ×2 IMPLANT
CHLORAPREP W/TINT 26ML (MISCELLANEOUS) ×2 IMPLANT
CLOTH BEACON ORANGE TIMEOUT ST (SAFETY) ×2 IMPLANT
DERMABOND ADVANCED (GAUZE/BANDAGES/DRESSINGS) ×2 IMPLANT
GLOVE BIO SURGEON STRL SZ 6.5 (GLOVE) ×4 IMPLANT
GOWN PREVENTION PLUS LG XLONG (DISPOSABLE) ×4 IMPLANT
PACK LAPAROSCOPY BASIN (CUSTOM PROCEDURE TRAY) ×2 IMPLANT
SUT VIC AB 3-0 PS2 18 (SUTURE)
SUT VIC AB 3-0 PS2 18XBRD (SUTURE) IMPLANT
SUT VICRYL 0 UR6 27IN ABS (SUTURE) IMPLANT
TOWEL OR 17X24 6PK STRL BLUE (TOWEL DISPOSABLE) ×4 IMPLANT
TROCAR XCEL NON-BLD 11X100MML (ENDOMECHANICALS) ×2 IMPLANT
WATER STERILE IRR 1000ML POUR (IV SOLUTION) ×2 IMPLANT

## 2010-10-01 NOTE — Transfer of Care (Signed)
  Anesthesia Post-op Note  Patient: [name]  Procedure(s) Performed:  LAPAROSCOPIC TUBAL LIGATION  Patient Location: PACU  Anesthesia Type: General  Level of Consciousness: awake, oriented and patient cooperative  Airway and Oxygen Therapy: Patient Spontanous Breathing and Patient connected to nasal cannula oxygen  Post-op Pain: mild  Post-op Assessment: Post-op Vital signs reviewed and Patient's Cardiovascular Status Stable  Post-op Vital Signs: Reviewed and stable  Complications: No apparent anesthesia complications

## 2010-10-01 NOTE — Anesthesia Postprocedure Evaluation (Signed)
  Anesthesia Post-op Note  Patient: [name]  Procedure(s) Performed:  LAPAROSCOPIC TUBAL LIGATION  Patient is awake and responsive. Pain and nausea are reasonably well controlled. Vital signs are stable and clinically acceptable. Oxygen saturation is clinically acceptable. There are no apparent anesthetic complications at this time. Patient is ready for discharge.

## 2010-10-01 NOTE — Anesthesia Preprocedure Evaluation (Addendum)
Anesthesia Evaluation  Name, MR# and DOB Patient awake  General Assessment Comment  Reviewed: Allergy & Precautions, H&P , Patient's Chart, lab work & pertinent test results, reviewed documented beta blocker date and time   Airway Mallampati: III TM Distance: >3 FB Neck ROM: full    Dental No notable dental hx.    Pulmonary  clear to auscultation  pulmonary exam normalPulmonary Exam Normal breath sounds clear to auscultation none    Cardiovascular - dysrhythmias (on BBlocker, ) regular Normal    Neuro/Psych   GI/Hepatic/Renal   Endo/Other  (+)   Morbid obesity  Abdominal   Musculoskeletal   Hematology   Peds  Reproductive/Obstetrics    Anesthesia Other Findings            Anesthesia Physical Anesthesia Plan  ASA: III  Anesthesia Plan: General   Post-op Pain Management:    Induction: Intravenous  Airway Management Planned: Oral ETT  Additional Equipment:   Intra-op Plan:   Post-operative Plan:   Informed Consent: I have reviewed the patients History and Physical, chart, labs and discussed the procedure including the risks, benefits and alternatives for the proposed anesthesia with the patient or authorized representative who has indicated his/her understanding and acceptance.   Dental Advisory Given  Plan Discussed with: CRNA and Surgeon  Anesthesia Plan Comments: (  Discussed  general anesthesia, including possible nausea, instrumentation of airway, sore throat,pulmonary aspiration, etc. I asked if the were any outstanding questions, or  concerns before we proceeded. )       Anesthesia Quick Evaluation

## 2010-10-01 NOTE — Anesthesia Procedure Notes (Addendum)
Procedure Name: Intubation Date/Time: 10/01/2010 3:55 PM Performed by: Isabella Bowens Pre-anesthesia Checklist: Patient identified, Patient being monitored, Emergency Drugs available, Timeout performed and Suction available Patient Re-evaluated:Patient Re-evaluated prior to inductionOxygen Delivery Method: Circle System Utilized Preoxygenation: Pre-oxygenation with 100% oxygen Intubation Type: IV induction Ventilation: Mask ventilation without difficulty Laryngoscope Size: Mac and 3 Grade View: Grade II Tube type: Oral Tube size: 7.0 mm Number of attempts: 1 Airway Equipment and Method: stylet Placement Confirmation: ETT inserted through vocal cords under direct vision,  positive ETCO2 and breath sounds checked- equal and bilateral Secured at: 21 cm Tube secured with: Tape Dental Injury: Teeth and Oropharynx as per pre-operative assessment

## 2010-10-01 NOTE — H&P (Signed)
  Chief Complaint: 30 y.o. who presents with for a laparoscopic BTL.  Details of Present Illness: See above  BP 113/73  Pulse 73  Temp(Src) 98.1 F (36.7 C) (Oral)  Resp 18  SpO2 99%  Breastfeeding? Yes  Past Medical History  Diagnosis Date  . Dizziness   . Palpitations   . SVT (supraventricular tachycardia)     with pregnancy only   History   Social History  . Marital Status: Married    Spouse Name: N/A    Number of Children: N/A  . Years of Education: N/A   Occupational History  . Not on file.   Social History Main Topics  . Smoking status: Never Smoker   . Smokeless tobacco: Not on file  . Alcohol Use: No  . Drug Use: No  . Sexually Active: Yes   Other Topics Concern  . Not on file   Social History Narrative    She lives in [place] with her husband and has 2  children.  She is pregnant with her third.    Family History  Problem Relation Age of Onset  . Diabetes    . Hyperlipidemia Mother   . Diabetes Father   . Hypertension Father   . Diabetes Paternal Grandmother     Pertinent items are noted in HPI.  Pre-Op Diagnosis: Desires Sterilization   Planned Procedure: Procedure(s): LAPAROSCOPIC TUBAL LIGATION  I have reviewed the patient's history and have completed the physical exam and [name] is acceptable for surgery.  Hannah Rainbow, MD 10/01/2010 3:47 PM

## 2010-10-01 NOTE — Op Note (Signed)
Procedure Note  [name] 30 y.o. 10/01/2010  Preoperative Diagnosis:  Multiparity, desires a sterilization procedure  Postoperative Diagnosis: Same  Procedure: Laparoscopic bilateral tubal ligation with fulguration  Surgeon: Antionette Char A  Indications:  The patient now presents for a sterilization procedure after discussing therapeutic alternatives.        Procedure Detail:  The patient was taken to the operating room and was placed on the operating table in the dorsal supine position.  After his satisfactory general anesthesia was achieved, the patient was placed in the semi-lithotomy position using Allen stirrups. The patient was prepped and draped in the usual sterile manner for vaginal laparoscopic procedure. A speculum was placed in the vagina. The anterior lip of the cervix was grasped with a single-tooth tenaculum. A Hulka manipulator was then advanced into the uterus and secured  to the anterior lip of the cervix as a means to manipulate the uterus. The single-tooth tenaculum and Hulka manipulator were then removed. The infraumbilical region was then anesthetized with local anesthesia, quarter percent Artane. A small incision was made to the skin and subcutaneous tissue. A 10 mm Optiview trocar was placed through the incision into the abdominal cavity diagnostic laparoscope with video camera attached was placed through the trocar sleeve and carbon dioxide was used to insufflate the abdominal and pelvic cavity. The pelvic contents were examined and the findings were described above. Kleppinger bipolar forceps were inserted through the operative port of the laparoscope. The left fallopian tube was identified and traced out to its fimbriated end. Then starting at the distal isthmus the proximal ampullary portion of the tube was coagulated in 4 contiguous areas. Each time the resistance meter went to 0 and the tube had been retracted away from an adjacent viscera. The right  fallopian tube was then manipulated in a similar fashion. The scope was removed and the excess carbon dioxide was remove through the port, before it was removed.  The subcutaneous layer of the incision was reapproximated with an interrupted figure-of eight 0-Vicryl suture on a UR 6 needle. The skin was reapproximated with Dermabond. The instruments removed from the vagina there is minimal bleeding from the cervix. Final sponge, instrument and needle counts were correct. The patient was awakened on the operating table and taken to the PACU in satisfactory condition.    Findings: Normal anatomic survey.  Estimated Blood Loss:  Minimal              Total IV Fluids:  per Anesthesiology        Condition: stable

## 2010-10-13 ENCOUNTER — Encounter (HOSPITAL_COMMUNITY): Payer: Self-pay | Admitting: Obstetrics & Gynecology

## 2010-10-21 ENCOUNTER — Ambulatory Visit: Payer: Medicaid Other | Admitting: Internal Medicine

## 2010-10-30 ENCOUNTER — Encounter: Payer: Self-pay | Admitting: Internal Medicine

## 2011-04-06 ENCOUNTER — Telehealth: Payer: Self-pay | Admitting: Internal Medicine

## 2011-04-06 NOTE — Telephone Encounter (Signed)
PT REQUESTING LETTER RE HER CONDITION, AND  POSSIBLITY THAT IT WOULD COME BACK IF GOT PREGNANT AGAIN HOW SERIOUS THE CONDITION ACTUALLY IS

## 2011-07-30 ENCOUNTER — Encounter: Payer: Self-pay | Admitting: Internal Medicine

## 2011-07-30 ENCOUNTER — Ambulatory Visit (INDEPENDENT_AMBULATORY_CARE_PROVIDER_SITE_OTHER): Payer: Self-pay | Admitting: Internal Medicine

## 2011-07-30 VITALS — BP 118/68 | HR 64 | Ht 68.0 in | Wt 249.0 lb

## 2011-07-30 DIAGNOSIS — R002 Palpitations: Secondary | ICD-10-CM

## 2011-07-30 DIAGNOSIS — I471 Supraventricular tachycardia: Secondary | ICD-10-CM

## 2011-07-30 NOTE — Assessment & Plan Note (Signed)
Her symptoms are now well-controlled. She will call us if she has recurrent tachypalpitations.

## 2011-07-30 NOTE — Patient Instructions (Signed)
Your physician recommends that you schedule a follow-up appointment as needed  

## 2011-07-30 NOTE — Progress Notes (Signed)
HPI Mrs. Yanik returns today for followup. She is a very pleasant 31 year old woman with a history of pregnancy-induced SVT and hypertension. The patient has had minimal palpitations since the delivery of her last child. With each pregnancy, she has had incessant SVT. Despite this her deliveries were unremarkable. Her children are doing well. She denies syncope. She is exercising regularly and try and lose weight. No Known Allergies   No current outpatient prescriptions on file.     Past Medical History  Diagnosis Date  . Dizziness   . Palpitations   . SVT (supraventricular tachycardia)     with pregnancy only    ROS:   All systems reviewed and negative except as noted in the HPI.   Past Surgical History  Procedure Date  . Dilation and curettage of uterus   . Laparoscopic tubal ligation 10/01/2010    Procedure: LAPAROSCOPIC TUBAL LIGATION;  Surgeon: [name], MD;  Location: [facility];  Service: Gynecology;  Laterality: Bilateral;     Family History  Problem Relation Age of Onset  . Diabetes    . Hyperlipidemia Mother   . Diabetes Father   . Hypertension Father   . Diabetes Paternal Grandmother      History   Social History  . Marital Status: Married    Spouse Name: N/A    Number of Children: N/A  . Years of Education: N/A   Occupational History  . Not on file.   Social History Main Topics  . Smoking status: Never Smoker   . Smokeless tobacco: Not on file  . Alcohol Use: No  . Drug Use: No  . Sexually Active: Yes   Other Topics Concern  . Not on file   Social History Narrative    She lives in [place] with her husband and has 2  children.  She is pregnant with her third.      BP 118/68  Pulse 64  Ht 5\' 8"  (1.727 m)  Wt 249 lb (112.946 kg)  BMI 37.86 kg/m2  Physical Exam:  Well appearing young woman, NAD HEENT: Unremarkable Neck:  No JVD, no thyromegally Lungs:  Clear with no wheezes, rales, or rhonchi. HEART:  Regular rate  rhythm, no murmurs, no rubs, no clicks Abd:  soft, positive bowel sounds, no organomegally, no rebound, no guarding Ext:  2 plus pulses, no edema, no cyanosis, no clubbing Skin:  No rashes no nodules Neuro:  CN II through XII intact, motor grossly intact  EKG Normal sinus rhythm  Assess/Plan:

## 2011-07-30 NOTE — Assessment & Plan Note (Signed)
Her symptoms are well controlled 

## 2011-08-09 ENCOUNTER — Ambulatory Visit: Payer: Self-pay | Admitting: Physician Assistant

## 2011-08-09 VITALS — BP 118/70 | HR 78 | Temp 98.5°F | Resp 18 | Ht 68.0 in | Wt 247.2 lb

## 2011-08-09 DIAGNOSIS — M549 Dorsalgia, unspecified: Secondary | ICD-10-CM

## 2011-08-09 DIAGNOSIS — G44209 Tension-type headache, unspecified, not intractable: Secondary | ICD-10-CM

## 2011-08-09 MED ORDER — CYCLOBENZAPRINE HCL 10 MG PO TABS: 10.0000 mg | ORAL_TABLET | Freq: Three times a day (TID) | ORAL | Status: AC | PRN

## 2011-08-09 MED ORDER — MELOXICAM 15 MG PO TABS: 15.0000 mg | ORAL_TABLET | Freq: Every day | ORAL | Status: AC

## 2011-08-09 NOTE — Patient Instructions (Addendum)
Warm compresses, hot showers or even time in the sauna can help the pain in your back, and possibly your head, as well.  If you can schedule a massage, that will also help. If the Flexeril (cyclobenzaprine, the muscle relaxer) makes you sleepy, take it only at bedtime.  If you develop new symptoms, or if you are not improving on the treatment in the next 2-3 days, you need to return for re-evaluation.

## 2011-08-09 NOTE — Progress Notes (Signed)
  Subjective:    Patient ID: [name], female    DOB: Apr 22, 1980, 31 y.o.   MRN: [id number]  HPI 31 y.o. year-old female presents with headache and backache that began yesterday morning upon waking.  Some brief ear pain earlier today.  Was able to exercise at the gym this morning, but then back pain recurred this evening.  Had lightheadedness with one exercise at the gym.  Developed nausea this morning at the gym, thought initially it was because she'd had a breakfast bar prior to her workout (she usually doesn't eat prior to exercise).    2 Bayer capsules helped a little.  No fever, chills, vomiting, diarrhea.  Constipation is not new.  No vision changes.  No numbness or tingling, no radicular pain.  No weakness.  No loss of bowel or bladder control.  Past Medical History  Diagnosis Date  . Dizziness   . Palpitations   . SVT (supraventricular tachycardia)     with pregnancy only   No Known Allergies  Review of Systems As above. Left eyelid and upper lip twitching.      Objective:   Physical Exam Vital signs noted. Well-developed, well nourished hispanic female who is awake, alert and oriented, in NAD. HEENT: [place]/AT, PERRL, EOMI.  Sclera and conjunctiva are clear.  Funduscopic exam is normal bilaterally.  No nystagmus. EAC are patent, TMs are normal in appearance. Nasal mucosa is pink and moist. OP is clear. Neck: supple, non-tender, no lymphadenopathy, thyromegaly.  Back: there is mild tenderness in the left trapezius, cervical paraspinous muscles and occiput.  No spinous tenderness.  Lumbar paraspinous muscle tenderness on the right.  No SI joint tenderness.  FROM of the neck and back, with mild tenderness in the right lumbar region with all movements. Heart: RRR, no murmur Lungs: CTA Extremities: no cyanosis, clubbing or edema. Skin: warm and dry without rash. Neurologic:  Normal tone, motion, sensation.  Normal DTRs, strength.     Assessment & Plan:   1. Headache,  tension-type  meloxicam (MOBIC) 15 MG tablet  2. Back pain  meloxicam (MOBIC) 15 MG tablet, cyclobenzaprine (FLEXERIL) 10 MG tablet   Anticipatory guidance provided.  RTC if symptoms persist 2-3 additional days, sooner if they worsen.

## 2011-08-24 ENCOUNTER — Encounter: Payer: Self-pay | Admitting: Physician Assistant

## 2013-01-06 IMAGING — US US OB COMP +14 WK
1 series · 14 of 28 positions shown · non-contrast
Comparison: none

[Series 1: us ob comp +14 wk · 0.20mm/px · 14 of 62 slices shown]
[im 3/62]
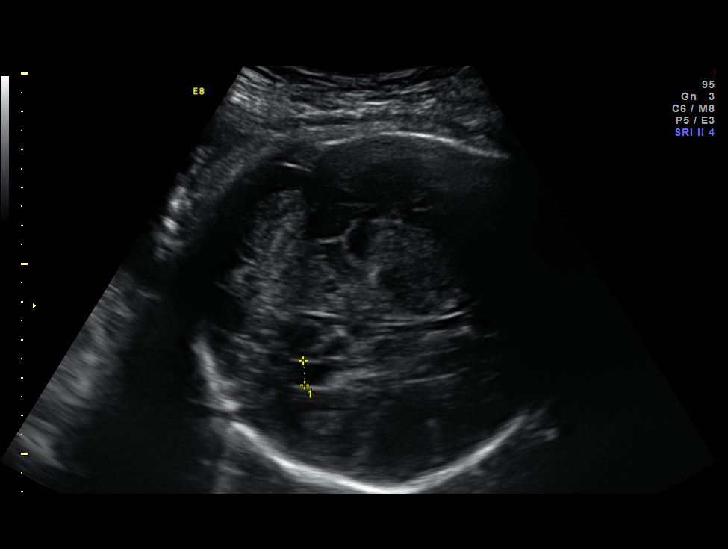
[im 7/62]
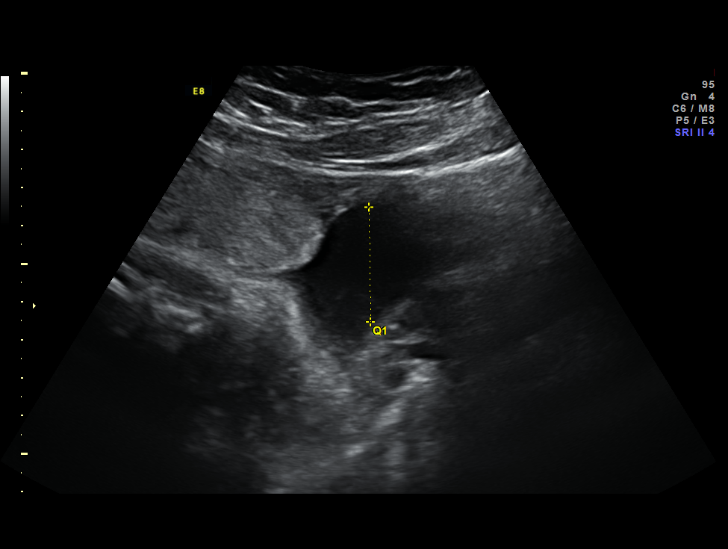
[im 12/62]
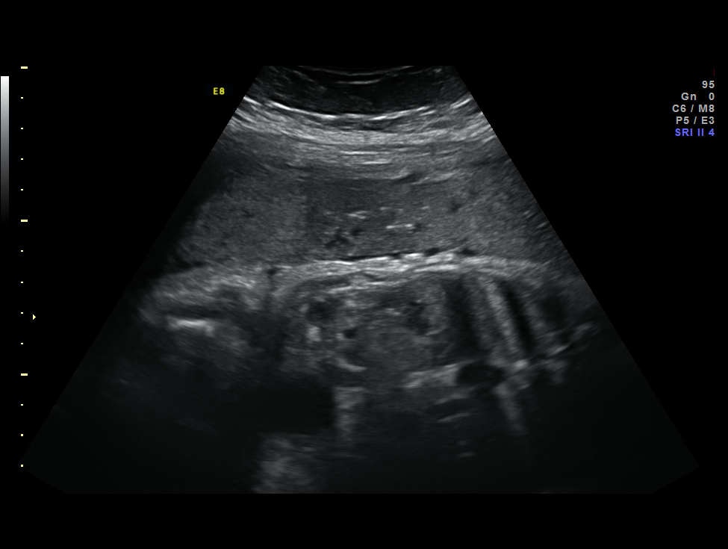
[im 16/62]
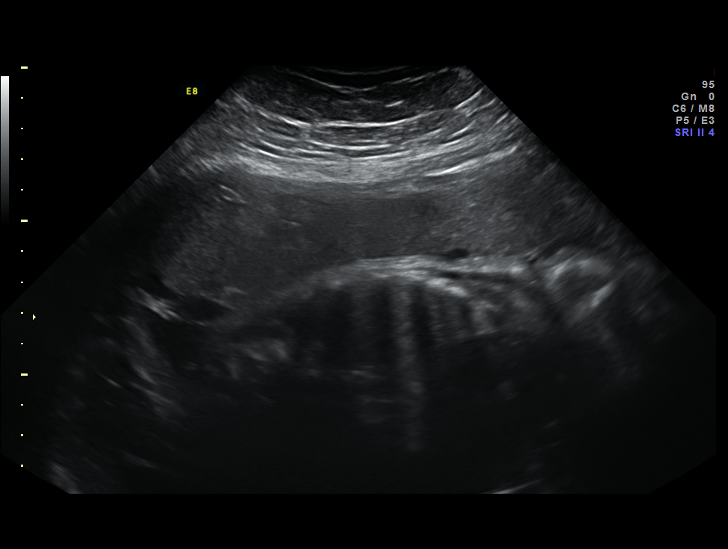
[im 21/62]
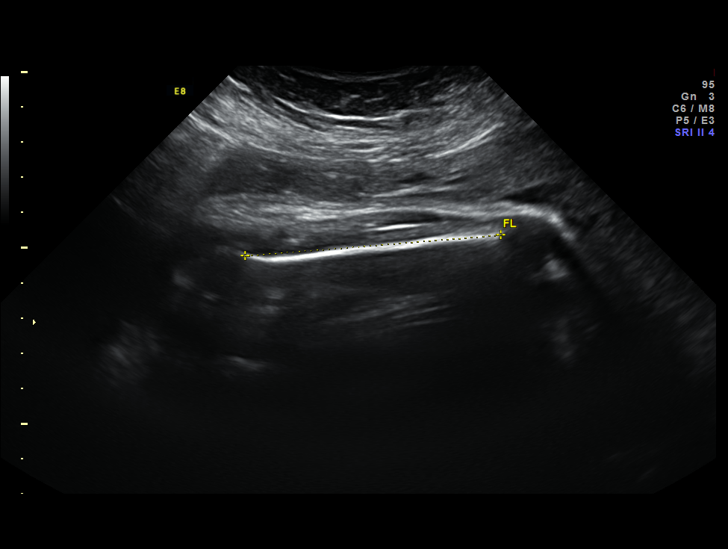
[im 25/62]
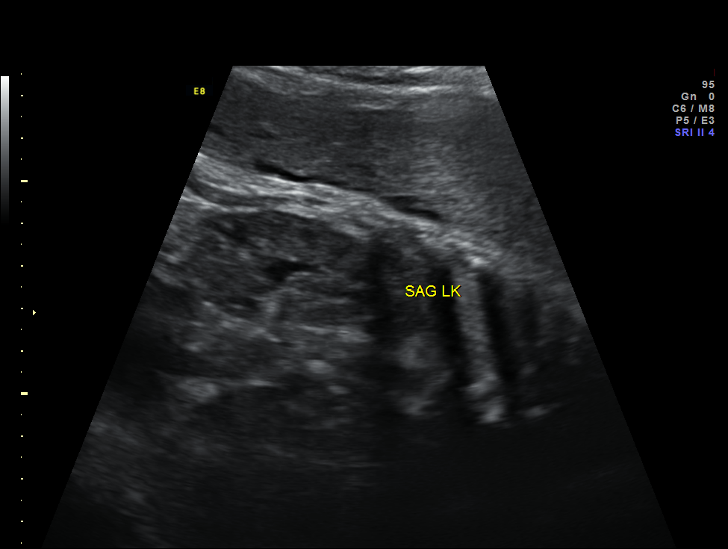
[im 30/62]
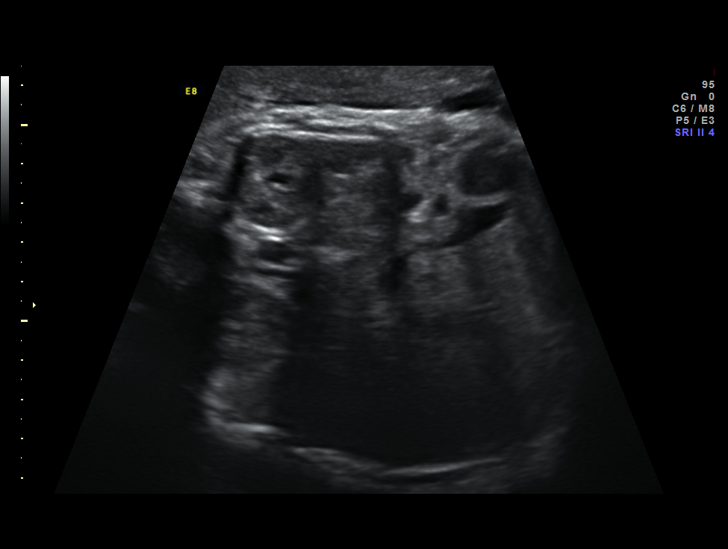
[im 34/62]
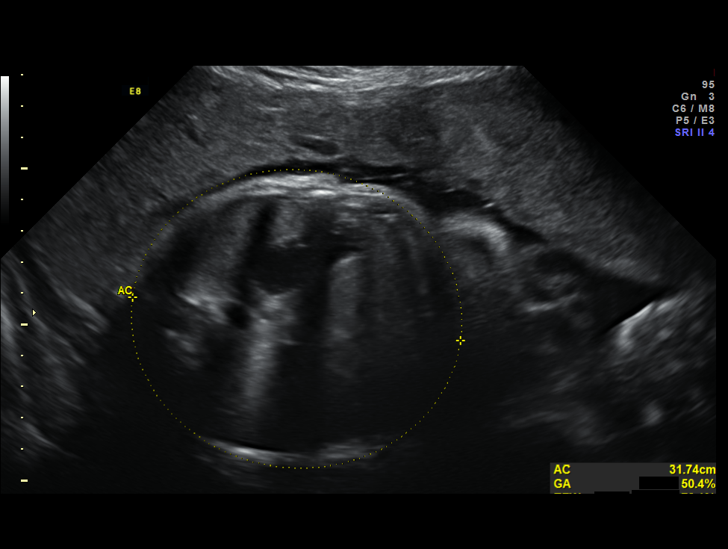
[im 39/62]
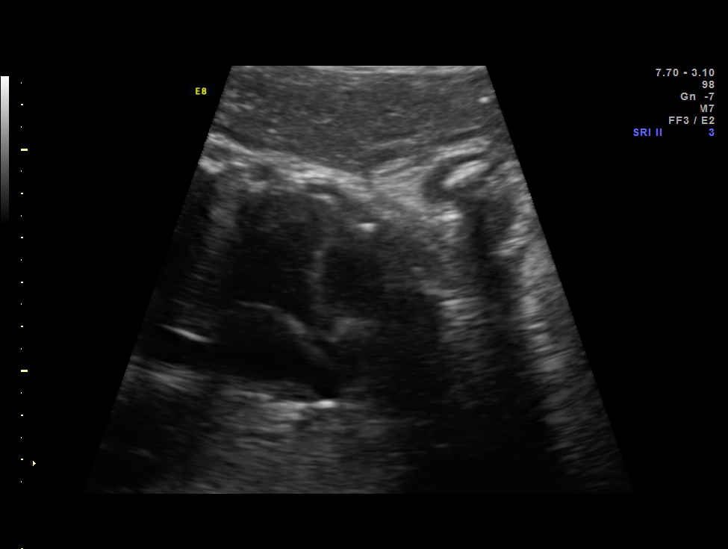
[im 43/62]
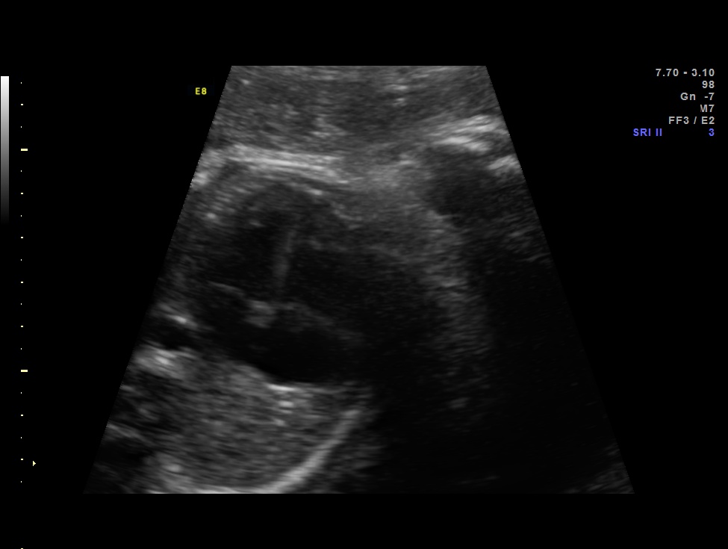
[im 48/62]
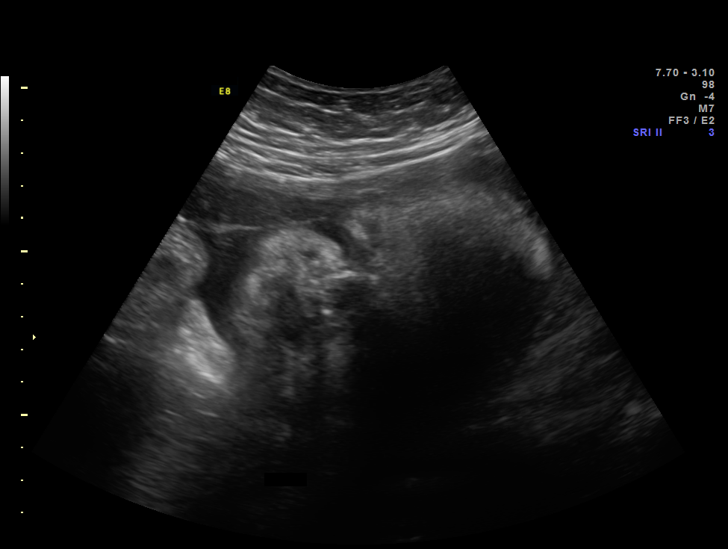
[im 52/62]
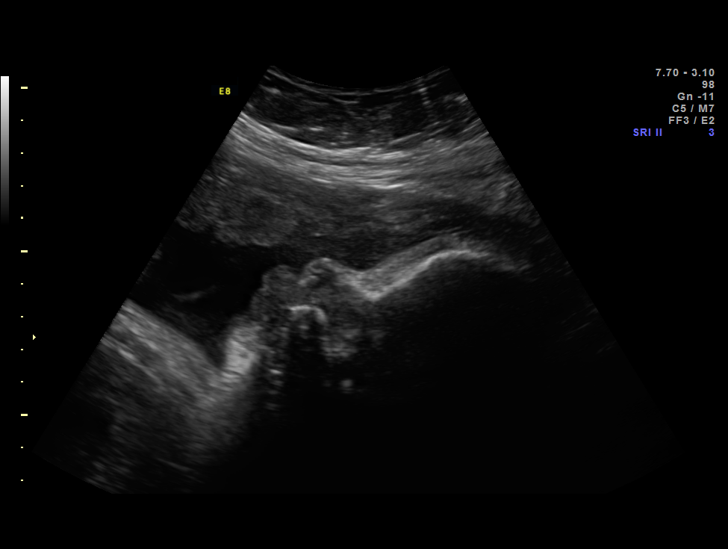
[im 57/62]
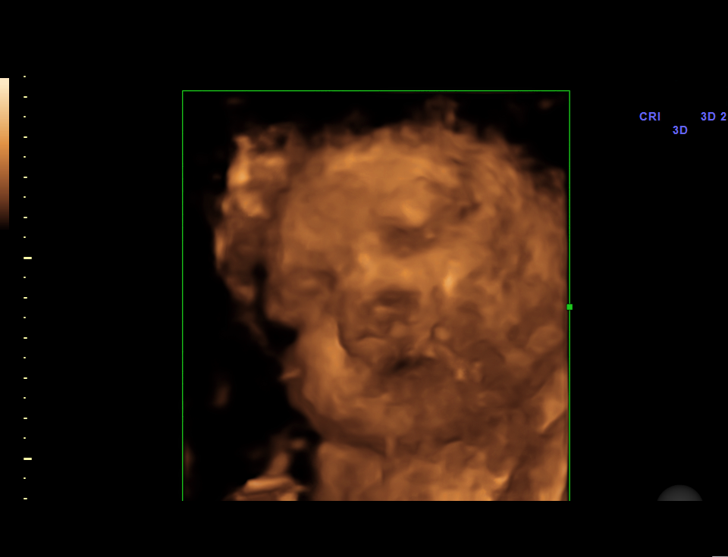
[im 62/62]
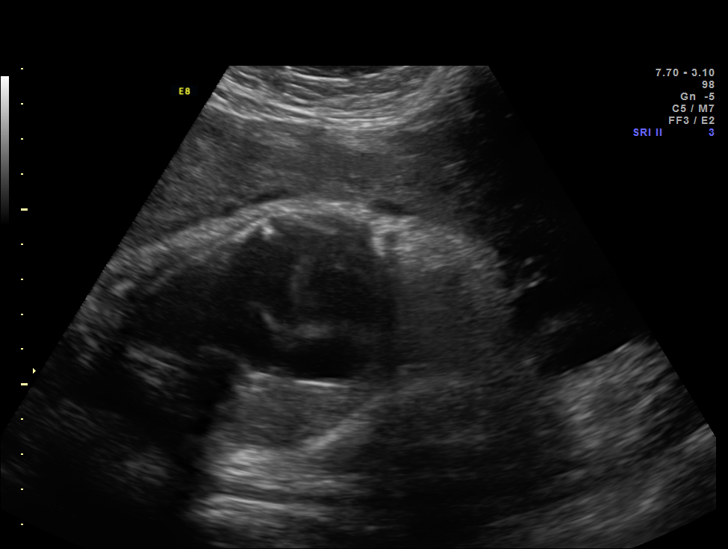

[14 of 28 positions shown; findings below may reference images not displayed]

Canned report from images found in remote index.

Refer to host system for actual result text.

## 2013-03-03 ENCOUNTER — Encounter: Payer: Self-pay | Admitting: Cardiology

## 2013-03-03 DIAGNOSIS — R42 Dizziness and giddiness: Secondary | ICD-10-CM | POA: Insufficient documentation

## 2013-03-03 DIAGNOSIS — I208 Other forms of angina pectoris: Secondary | ICD-10-CM | POA: Insufficient documentation

## 2013-03-03 DIAGNOSIS — I471 Supraventricular tachycardia: Secondary | ICD-10-CM | POA: Insufficient documentation

## 2013-06-28 ENCOUNTER — Ambulatory Visit: Payer: Self-pay | Admitting: Obstetrics & Gynecology

## 2013-12-03 ENCOUNTER — Encounter: Payer: Self-pay | Admitting: Cardiology

## 2014-01-28 ENCOUNTER — Encounter: Payer: Self-pay | Admitting: *Deleted

## 2014-01-29 ENCOUNTER — Encounter: Payer: Self-pay | Admitting: Obstetrics & Gynecology

## 2014-05-28 ENCOUNTER — Telehealth: Payer: Self-pay | Admitting: Internal Medicine

## 2014-05-28 NOTE — Telephone Encounter (Signed)
Spoke with the patient and she states she started working out yesterday. This was the first time in a while.  She is c/o "her heart Itching".  Dr Ladona Ridgelaylor had seen her in 2013 for SVT.  She denies her heart is racing or felt it racing with exercise.  I have suggested she follow up with PCP as "heart itching" is hard to discern.  As I was trying to obtain more details she interrupted me and said " she was going to have to let me go"  Then hung up  Dr Ladona Ridgelaylor is aware and agrees with plan

## 2014-05-28 NOTE — Telephone Encounter (Signed)
New message     Patient calling wanted to be seen this week by Dr. Ladona Ridgelaylor stating she started exercise yesterday C/O felt like her heart was itching. Slow down when she started walking.    Made appt on  5.17.2016 @ 2:30 pm wanted a message to be taken for the nurse to call her back. .Marland Kitchen

## 2014-06-18 ENCOUNTER — Encounter: Payer: Self-pay | Admitting: Internal Medicine

## 2014-07-08 ENCOUNTER — Encounter: Payer: Self-pay | Admitting: Internal Medicine

## 2014-07-23 ENCOUNTER — Telehealth: Payer: Self-pay | Admitting: Family Medicine

## 2015-09-05 NOTE — Telephone Encounter (Signed)
error 

## 2023-02-16 ENCOUNTER — Other Ambulatory Visit (HOSPITAL_COMMUNITY): Payer: Self-pay
